# Patient Record
Sex: Female | Born: 1975 | Race: White | Hispanic: No | Marital: Married | Smoking: Never smoker
Health system: Southern US, Community
[De-identification: ages and names within clinical notes are randomized; demographics above are authoritative.]

## PROBLEM LIST (undated history)

## (undated) DIAGNOSIS — F329 Major depressive disorder, single episode, unspecified: Secondary | ICD-10-CM

## (undated) DIAGNOSIS — M5126 Other intervertebral disc displacement, lumbar region: Secondary | ICD-10-CM

## (undated) DIAGNOSIS — K589 Irritable bowel syndrome without diarrhea: Secondary | ICD-10-CM

## (undated) DIAGNOSIS — G4730 Sleep apnea, unspecified: Secondary | ICD-10-CM

## (undated) DIAGNOSIS — F32A Depression, unspecified: Secondary | ICD-10-CM

## (undated) DIAGNOSIS — F419 Anxiety disorder, unspecified: Secondary | ICD-10-CM

## (undated) DIAGNOSIS — M51369 Other intervertebral disc degeneration, lumbar region without mention of lumbar back pain or lower extremity pain: Secondary | ICD-10-CM

## (undated) DIAGNOSIS — M5136 Other intervertebral disc degeneration, lumbar region: Secondary | ICD-10-CM

## (undated) DIAGNOSIS — G709 Myoneural disorder, unspecified: Secondary | ICD-10-CM

## (undated) DIAGNOSIS — C801 Malignant (primary) neoplasm, unspecified: Secondary | ICD-10-CM

## (undated) DIAGNOSIS — G43909 Migraine, unspecified, not intractable, without status migrainosus: Secondary | ICD-10-CM

## (undated) HISTORY — PX: THYROID LOBECTOMY: SHX420

## (undated) HISTORY — PX: NASAL SINUS SURGERY: SHX719

---

## 1999-12-14 ENCOUNTER — Other Ambulatory Visit: Admission: RE | Admit: 1999-12-14 | Discharge: 1999-12-14 | Payer: Self-pay | Admitting: Family Medicine

## 2000-11-24 ENCOUNTER — Other Ambulatory Visit: Admission: RE | Admit: 2000-11-24 | Discharge: 2000-11-24 | Payer: Self-pay

## 2006-11-22 ENCOUNTER — Emergency Department: Payer: Self-pay | Admitting: Emergency Medicine

## 2006-11-22 ENCOUNTER — Other Ambulatory Visit: Payer: Self-pay

## 2006-11-26 ENCOUNTER — Ambulatory Visit: Payer: Self-pay | Admitting: Emergency Medicine

## 2007-12-20 ENCOUNTER — Ambulatory Visit: Payer: Self-pay | Admitting: Family Medicine

## 2008-08-03 ENCOUNTER — Encounter: Payer: Self-pay | Admitting: Physician Assistant

## 2008-08-11 ENCOUNTER — Encounter: Payer: Self-pay | Admitting: Physician Assistant

## 2009-02-11 HISTORY — PX: WISDOM TOOTH EXTRACTION: SHX21

## 2009-10-23 ENCOUNTER — Emergency Department (HOSPITAL_COMMUNITY): Admission: EM | Admit: 2009-10-23 | Discharge: 2009-10-23 | Payer: Self-pay | Admitting: Family Medicine

## 2010-09-10 ENCOUNTER — Inpatient Hospital Stay (INDEPENDENT_AMBULATORY_CARE_PROVIDER_SITE_OTHER)
Admission: RE | Admit: 2010-09-10 | Discharge: 2010-09-10 | Disposition: A | Discharge status: Home / Self Care | Payer: 59 | Source: Ambulatory Visit | Attending: Emergency Medicine | Admitting: Emergency Medicine

## 2010-09-10 DIAGNOSIS — J019 Acute sinusitis, unspecified: Secondary | ICD-10-CM

## 2010-11-29 DIAGNOSIS — K589 Irritable bowel syndrome without diarrhea: Secondary | ICD-10-CM | POA: Insufficient documentation

## 2011-04-09 DIAGNOSIS — G43909 Migraine, unspecified, not intractable, without status migrainosus: Secondary | ICD-10-CM | POA: Insufficient documentation

## 2015-01-08 ENCOUNTER — Encounter: Payer: Self-pay | Admitting: Gynecology

## 2015-01-08 ENCOUNTER — Ambulatory Visit
Admission: EM | Admit: 2015-01-08 | Discharge: 2015-01-08 | Disposition: A | Discharge status: Home / Self Care | Payer: Managed Care, Other (non HMO) | Attending: Family Medicine | Admitting: Family Medicine

## 2015-01-08 DIAGNOSIS — S338XXA Sprain of other parts of lumbar spine and pelvis, initial encounter: Secondary | ICD-10-CM

## 2015-01-08 DIAGNOSIS — S39012A Strain of muscle, fascia and tendon of lower back, initial encounter: Secondary | ICD-10-CM

## 2015-01-08 DIAGNOSIS — M5431 Sciatica, right side: Secondary | ICD-10-CM | POA: Diagnosis not present

## 2015-01-08 HISTORY — DX: Other intervertebral disc degeneration, lumbar region: M51.36

## 2015-01-08 HISTORY — DX: Major depressive disorder, single episode, unspecified: F32.9

## 2015-01-08 HISTORY — DX: Anxiety disorder, unspecified: F41.9

## 2015-01-08 HISTORY — DX: Irritable bowel syndrome, unspecified: K58.9

## 2015-01-08 HISTORY — DX: Depression, unspecified: F32.A

## 2015-01-08 HISTORY — DX: Sleep apnea, unspecified: G47.30

## 2015-01-08 HISTORY — DX: Migraine, unspecified, not intractable, without status migrainosus: G43.909

## 2015-01-08 HISTORY — DX: Other intervertebral disc displacement, lumbar region: M51.26

## 2015-01-08 HISTORY — DX: Other intervertebral disc degeneration, lumbar region without mention of lumbar back pain or lower extremity pain: M51.369

## 2015-01-08 LAB — URINALYSIS COMPLETE WITH MICROSCOPIC (ARMC ONLY)
BILIRUBIN URINE: NEGATIVE
Bacteria, UA: NONE SEEN
Glucose, UA: NEGATIVE mg/dL
Ketones, ur: NEGATIVE mg/dL
Leukocytes, UA: NEGATIVE
NITRITE: NEGATIVE
PROTEIN: NEGATIVE mg/dL
Specific Gravity, Urine: 1.015 (ref 1.005–1.030)
WBC, UA: NONE SEEN WBC/hpf (ref 0–5)
pH: 7 (ref 5.0–8.0)

## 2015-01-08 LAB — PREGNANCY, URINE: Preg Test, Ur: NEGATIVE

## 2015-01-08 MED ORDER — KETOROLAC TROMETHAMINE 60 MG/2ML IM SOLN
60.0000 mg | Freq: Once | INTRAMUSCULAR | Status: AC
  Administered 2015-01-08: 60 mg via INTRAMUSCULAR

## 2015-01-08 MED ORDER — OXYCODONE-ACETAMINOPHEN 5-325 MG PO TABS: 1.0000 | ORAL_TABLET | Freq: Three times a day (TID) | ORAL | Status: DC | PRN

## 2015-01-08 MED ORDER — PREDNISONE 10 MG PO TABS: ORAL_TABLET | ORAL | Status: DC

## 2015-01-08 NOTE — Discharge Instructions (Signed)
Take medication as prescribed. Rest. Apply ice. Avoid strenuous activity. Stretch well multiple times per day as discussed.  Follow-up with her primary care physician this week as discussed. Return to urgent care or proceed to ER for increased pain, urinary or bowel changes, difficulty walking,  for new or worsening concerns.   Sciatica Sciatica is pain, weakness, numbness, or tingling along the path of the sciatic nerve. The nerve starts in the lower back and runs down the back of each leg. The nerve controls the muscles in the lower leg and in the back of the knee, while also providing sensation to the back of the thigh, lower leg, and the sole of your foot. Sciatica is a symptom of another medical condition. For instance, nerve damage or certain conditions, such as a herniated disk or bone spur on the spine, pinch or put pressure on the sciatic nerve. This causes the pain, weakness, or other sensations normally associated with sciatica. Generally, sciatica only affects one side of the body. CAUSES  1. Herniated or slipped disc. 2. Degenerative disk disease. 3. A pain disorder involving the narrow muscle in the buttocks (piriformis syndrome). 4. Pelvic injury or fracture. 5. Pregnancy. 6. Tumor (rare). SYMPTOMS  Symptoms can vary from mild to very severe. The symptoms usually travel from the low back to the buttocks and down the back of the leg. Symptoms can include: 1. Mild tingling or dull aches in the lower back, leg, or hip. 2. Numbness in the back of the calf or sole of the foot. 3. Burning sensations in the lower back, leg, or hip. 4. Sharp pains in the lower back, leg, or hip. 5. Leg weakness. 6. Severe back pain inhibiting movement. These symptoms may get worse with coughing, sneezing, laughing, or prolonged sitting or standing. Also, being overweight may worsen symptoms. DIAGNOSIS  Your caregiver will perform a physical exam to look for common symptoms of sciatica. He or she may ask  you to do certain movements or activities that would trigger sciatic nerve pain. Other tests may be performed to find the cause of the sciatica. These may include: 1. Blood tests. 2. X-rays. 3. Imaging tests, such as an MRI or CT scan. TREATMENT  Treatment is directed at the cause of the sciatic pain. Sometimes, treatment is not necessary and the pain and discomfort goes away on its own. If treatment is needed, your caregiver may suggest: 1. Over-the-counter medicines to relieve pain. 2. Prescription medicines, such as anti-inflammatory medicine, muscle relaxants, or narcotics. 3. Applying heat or ice to the painful area. 4. Steroid injections to lessen pain, irritation, and inflammation around the nerve. 5. Reducing activity during periods of pain. 6. Exercising and stretching to strengthen your abdomen and improve flexibility of your spine. Your caregiver may suggest losing weight if the extra weight makes the back pain worse. 7. Physical therapy. 8. Surgery to eliminate what is pressing or pinching the nerve, such as a bone spur or part of a herniated disk. HOME CARE INSTRUCTIONS  1. Only take over-the-counter or prescription medicines for pain or discomfort as directed by your caregiver. 2. Apply ice to the affected area for 20 minutes, 3-4 times a day for the first 48-72 hours. Then try heat in the same way. 3. Exercise, stretch, or perform your usual activities if these do not aggravate your pain. 4. Attend physical therapy sessions as directed by your caregiver. 5. Keep all follow-up appointments as directed by your caregiver. 6. Do not wear high heels or  shoes that do not provide proper support. 7. Check your mattress to see if it is too soft. A firm mattress may lessen your pain and discomfort. SEEK IMMEDIATE MEDICAL CARE IF:  1. You lose control of your bowel or bladder (incontinence). 2. You have increasing weakness in the lower back, pelvis, buttocks, or legs. 3. You have redness  or swelling of your back. 4. You have a burning sensation when you urinate. 5. You have pain that gets worse when you lie down or awakens you at night. 6. Your pain is worse than you have experienced in the past. 7. Your pain is lasting longer than 4 weeks. 8. You are suddenly losing weight without reason. MAKE SURE YOU: 1. Understand these instructions. 2. Will watch your condition. 3. Will get help right away if you are not doing well or get worse.   This information is not intended to replace advice given to you by your health care provider. Make sure you discuss any questions you have with your health care provider.   Document Released: 01/22/2001 Document Revised: 10/19/2014 Document Reviewed: 06/09/2011 Elsevier Interactive Patient Education 2016 [employer].  Back Exercises If you have pain in your back, do these exercises 2-3 times each day or as told by your doctor. When the pain goes away, do the exercises once each day, but repeat the steps more times for each exercise (do more repetitions). If you do not have pain in your back, do these exercises once each day or as told by your doctor. EXERCISES Single Knee to Chest Do these steps 3-5 times in a row for each leg: 7. Lie on your back on a firm bed or the floor with your legs stretched out. 8. Bring one knee to your chest. 9. Hold your knee to your chest by grabbing your knee or thigh. 10. Pull on your knee until you feel a gentle stretch in your lower back. 11. Keep doing the stretch for 10-30 seconds. 12. Slowly let go of your leg and straighten it. Pelvic Tilt Do these steps 5-10 times in a row: 7. Lie on your back on a firm bed or the floor with your legs stretched out. 8. Bend your knees so they point up to the ceiling. Your feet should be flat on the floor. 9. Tighten your lower belly (abdomen) muscles to press your lower back against the floor. This will make your tailbone point up to the ceiling instead of pointing  down to your feet or the floor. 10. Stay in this position for 5-10 seconds while you gently tighten your muscles and breathe evenly. Cat-Cow Do these steps until your lower back bends more easily: 4. Get on your hands and knees on a firm surface. Keep your hands under your shoulders, and keep your knees under your hips. You may put padding under your knees. 5. Let your head hang down, and make your tailbone point down to the floor so your lower back is round like the back of a cat. 6. Stay in this position for 5 seconds. 7. Slowly lift your head and make your tailbone point up to the ceiling so your back hangs low (sags) like the back of a cow. 8. Stay in this position for 5 seconds. Press-Ups Do these steps 5-10 times in a row: 9. Lie on your belly (face-down) on the floor. 10. Place your hands near your head, about shoulder-width apart. 11. While you keep your back relaxed and keep your hips on the  floor, slowly straighten your arms to raise the top half of your body and lift your shoulders. Do not use your back muscles. To make yourself more comfortable, you may change where you place your hands. 12. Stay in this position for 5 seconds. 13. Slowly return to lying flat on the floor. Bridges Do these steps 10 times in a row: 8. Lie on your back on a firm surface. 9. Bend your knees so they point up to the ceiling. Your feet should be flat on the floor. 10. Tighten your butt muscles and lift your butt off of the floor until your waist is almost as high as your knees. If you do not feel the muscles working in your butt and the back of your thighs, slide your feet 1-2 inches farther away from your butt. 11. Stay in this position for 3-5 seconds. 12. Slowly lower your butt to the floor, and let your butt muscles relax. If this exercise is too easy, try doing it with your arms crossed over your chest. Belly Crunches Do these steps 5-10 times in a row: 9. Lie on your back on a firm bed or the  floor with your legs stretched out. 10. Bend your knees so they point up to the ceiling. Your feet should be flat on the floor. 61. Cross your arms over your chest. 12. Tip your chin a little bit toward your chest but do not bend your neck. 63. Tighten your belly muscles and slowly raise your chest just enough to lift your shoulder blades a tiny bit off of the floor. 14. Slowly lower your chest and your head to the floor. Back Lifts Do these steps 5-10 times in a row: 4. Lie on your belly (face-down) with your arms at your sides, and rest your forehead on the floor. 5. Tighten the muscles in your legs and your butt. 6. Slowly lift your chest off of the floor while you keep your hips on the floor. Keep the back of your head in line with the curve in your back. Look at the floor while you do this. 7. Stay in this position for 3-5 seconds. 8. Slowly lower your chest and your face to the floor. GET HELP IF:  Your back pain gets a lot worse when you do an exercise.  Your back pain does not lessen 2 hours after you exercise. If you have any of these problems, stop doing the exercises. Do not do them again unless your doctor says it is okay. GET HELP RIGHT AWAY IF:  You have sudden, very bad back pain. If this happens, stop doing the exercises. Do not do them again unless your doctor says it is okay.   This information is not intended to replace advice given to you by your health care provider. Make sure you discuss any questions you have with your health care provider.   Document Released: 03/02/2010 Document Revised: 10/19/2014 Document Reviewed: 03/24/2014 Elsevier Interactive Patient Education [employer].

## 2015-01-08 NOTE — ED Notes (Signed)
Patient c/o muscle spasm in lower back radiates to stomach. Patient question the bulging disc she had 8 yrs ago.

## 2015-01-08 NOTE — ED Provider Notes (Signed)
[facility]  ____________________________________________  Time seen: Approximately 10:39 AM  I have reviewed the triage vital signs and the nursing notes.   HISTORY  Chief Complaint Spasms   HPI Vickie Reed is a 39 y.o. female presents for the complaints of right lower back pain. Patient reports that pain onset was 4 days ago. States that she will woke up with the pain in right lower back. States that she initially thought that she had pulled a muscle. Does admit that the day before she had lifted a heavy bag of dog food and twisted. Denies fall or trauma.  Patient reports that she had mild pain initially, but reports that she has had gradual increase in pain in same area. States that if she sits still or lies still she only has minimal pain. however reports that with movement, especially twisting,  her pain increases. States that she feels intermittent spasms in the right lower back triggered by movements. States that pain does intermittently radiates down right lower leg. States right lower leg radiation pain is only with movement and described as a sharp pain down the back of her leg. States no pain at rest. Denies urinary or bowel retention or incontinence. Denies difficulty moving or ambulating.  Denies fall or trauma. Does report history of similar with cervical bulging disc that radiated down left arm, denies current cervical pain. However denies known lumbar problems. States current pain is 7/10 aching and catching with movement. States unrelieved with home flexeril.    Past Medical History  Diagnosis Date  . Depression   . Anxiety   . IBS (irritable bowel syndrome)   . Sleep apnea   . Bulging lumbar disc     cervica spine  . Migraine     There are no active problems to display for this patient.   Past Surgical History  Procedure Laterality Date  . Nasal sinus surgery      Current Outpatient Rx  Name  Route  Sig  Dispense  Refill  . buPROPion  (WELLBUTRIN XL) 300 MG 24 hr tablet   Oral   Take 300 mg by mouth daily.         . clindamycin (CLINDAMAX) 1 % gel   Topical   Apply topically 2 (two) times daily.         Marland Kitchen escitalopram (LEXAPRO) 20 MG tablet   Oral   Take 20 mg by mouth daily.         Marland Kitchen levonorgestrel-ethinyl estradiol (AVIANE,ALESSE,LESSINA) 0.1-20 MG-MCG tablet   Oral   Take 1 tablet by mouth daily.          PCP: Harvel Ricks at [facility]  Allergies Sulfa antibiotics  No family history on file.  Social History Social History  Substance Use Topics  . Smoking status: Never Smoker   . Smokeless tobacco: None  . Alcohol Use: No    Review of Systems Constitutional: No fever/chills Eyes: No visual changes. ENT: No sore throat. Cardiovascular: Denies chest pain. Respiratory: Denies shortness of breath. Gastrointestinal: No abdominal pain.  No nausea, no vomiting.  No diarrhea.  No constipation. Genitourinary: Negative for dysuria. Musculoskeletal: positive for back pain. Skin: Negative for rash. Neurological: Negative for headaches, focal weakness or numbness.  10-point ROS otherwise negative.  ____________________________________________   PHYSICAL EXAM:  VITAL SIGNS: ED Triage Vitals  Enc Vitals Group     BP 01/08/15 1007 117/53 mmHg     Pulse Rate 01/08/15 1007 60     Resp 01/08/15  1007 18     Temp 01/08/15 1007 98.4 F (36.9 C)     Temp Source 01/08/15 1007 Oral     SpO2 01/08/15 1007 100 %     Weight 01/08/15 1007 265 lb (120.203 kg)     Height 01/08/15 1007 5\' 6"  (1.676 m)     Head Cir --      Peak Flow --      Pain Score 01/08/15 1012 4     Pain Loc --      Pain Edu? --      Excl. in [place]? --     Constitutional: Alert and oriented. Well appearing and in no acute distress. Eyes: Conjunctivae are normal. PERRL. EOMI. Head: Atraumatic.  Nose: No congestion/rhinnorhea.  Mouth/Throat: Mucous membranes are moist.  Oropharynx non-erythematous. Neck: No stridor.  No cervical  spine tenderness to palpation. Hematological/Lymphatic/Immunilogical: No cervical lymphadenopathy. Cardiovascular: Normal rate, regular rhythm. Grossly normal heart sounds.  Good peripheral circulation. Respiratory: Normal respiratory effort.  No retractions. Lungs CTAB. Gastrointestinal: Soft and nontender. No distention. Normal Bowel sounds.  No CVA tenderness. Musculoskeletal: No lower or upper extremity tenderness nor edema.  No joint effusions. Bilateral pedal pulses equal and easily palpated.  No midline cervical, thoracic or lumbar TTP. Right lower lumbar at sciatic notch mild to mod TTP, no swelling, no ecchymosis. No pain with left straight leg raise, mild pain with right straight leg raise. No saddle anesthesia. Bilateral plantar and dorsiflexion strong and equal. Steady gait. Changes positions quickly in room without distress.  Neurologic:  Normal speech and language. No gross focal neurologic deficits are appreciated. No gait instability. Skin:  Skin is warm, dry and intact. No rash noted. Psychiatric: Mood and affect are normal. Speech and behavior are normal.  ____________________________________________   LABS (all labs ordered are listed, but only abnormal results are displayed)  Labs Reviewed  URINALYSIS COMPLETEWITH MICROSCOPIC ([place]) - Abnormal; Notable for the following:    Color, Urine STRAW (*)    Hgb urine dipstick TRACE (*)    Squamous Epithelial / LPF 0-5 (*)    All other components within normal limits  PREGNANCY, URINE     INITIAL IMPRESSION / ASSESSMENT AND PLAN / ED COURSE  Pertinent labs & imaging results that were available during my care of the patient were reviewed by me and considered in my medical decision making (see chart for details).  Very well appearing. No acute distress. Presents for right lower back pain. Pain primarily with movement. Denies fall or trauma. Patient states that pain initially onset consistent with pulled muscle. Denies  urinary or bowel changes. Ambulatory in room a steady gait. No focal neurological deficits. Point tender over right sciatic notch, pain reproducible on palpation and with movement. Suspect right sciatica and muscular strain. No midline tenderness. Discussed patient has no trauma no midline tenderness do not recommend imaging at this time. Recommend is pain continues then to have imaging. Patient verbalized understanding and states that she does not want x-ray at this time. Will also evaluate urinalysis.  Will treat patient with oral prednisone taper as well as when necessary Percocet. No recent narcotics according to [place] controlled substance database. Discussed follow-up with primary care physician this week. Discussed follow up with Primary care physician this week. Discussed follow up and return parameters including no resolution or any worsening concerns. Patient verbalized understanding and agreed to plan.   ____________________________________________   FINAL CLINICAL IMPRESSION(S) / ED DIAGNOSES  Final diagnoses:  Right sided sciatica  Lumbosacral strain, initial encounter       Marylene Land, NP 01/08/15 1117

## 2015-03-07 ENCOUNTER — Other Ambulatory Visit: Payer: Self-pay | Admitting: Obstetrics and Gynecology

## 2015-03-07 DIAGNOSIS — Z1231 Encounter for screening mammogram for malignant neoplasm of breast: Secondary | ICD-10-CM

## 2015-03-15 ENCOUNTER — Ambulatory Visit
Admission: RE | Admit: 2015-03-15 | Discharge: 2015-03-15 | Disposition: A | Discharge status: Home / Self Care | Payer: Managed Care, Other (non HMO) | Source: Ambulatory Visit | Attending: Obstetrics and Gynecology | Admitting: Obstetrics and Gynecology

## 2015-03-15 DIAGNOSIS — Z1231 Encounter for screening mammogram for malignant neoplasm of breast: Secondary | ICD-10-CM | POA: Diagnosis not present

## 2015-09-30 ENCOUNTER — Encounter: Payer: Self-pay | Admitting: Gynecology

## 2015-09-30 ENCOUNTER — Ambulatory Visit
Admission: EM | Admit: 2015-09-30 | Discharge: 2015-09-30 | Disposition: A | Discharge status: Home / Self Care | Payer: Managed Care, Other (non HMO) | Attending: Family Medicine | Admitting: Family Medicine

## 2015-09-30 DIAGNOSIS — L0291 Cutaneous abscess, unspecified: Secondary | ICD-10-CM | POA: Diagnosis not present

## 2015-09-30 DIAGNOSIS — L0390 Cellulitis, unspecified: Secondary | ICD-10-CM | POA: Diagnosis not present

## 2015-09-30 MED ORDER — CLINDAMYCIN HCL 300 MG PO CAPS
300.0000 mg | ORAL_CAPSULE | Freq: Three times a day (TID) | ORAL | 0 refills | Status: AC
Start: 2015-09-30 — End: 2015-10-07

## 2015-09-30 NOTE — ED Triage Notes (Signed)
Patient c/o abscess on Left buttock. Per patient prone to having recurrent abscess.

## 2015-09-30 NOTE — ED Provider Notes (Signed)
CSN: [id number]     Arrival date & time 09/30/15  1514 History   None    Chief Complaint  Patient presents with  . Cyst   (Consider location/radiation/quality/duration/timing/severity/associated sxs/prior Treatment) Vickie Reed is a 40 y.o female with history of recurrent abscess, presents today for abscess to her mild/right buttocks. Abscess have been present for 6 days. Home therapy includes running hot water over the area during shower. Have not tried warm compress.       Past Medical History:  Diagnosis Date  . Anxiety   . Bulging lumbar disc    cervica spine  . Depression   . IBS (irritable bowel syndrome)   . Migraine   . Sleep apnea    Past Surgical History:  Procedure Laterality Date  . NASAL SINUS SURGERY     Family History  Problem Relation Age of Onset  . Breast cancer Neg Hx    Social History  Substance Use Topics  . Smoking status: Never Smoker  . Smokeless tobacco: Never Used  . Alcohol use No   OB History    No data available     Review of Systems  Skin:       Positive for abscess to right/mid abscess  All other systems reviewed and are negative.   Allergies  Sulfa antibiotics  Home Medications   Prior to Admission medications   Medication Sig Start Date End Date Taking? Authorizing Provider  buPROPion (WELLBUTRIN XL) 300 MG 24 hr tablet Take 300 mg by mouth daily.   Yes Historical Provider, MD  clindamycin (CLINDAMAX) 1 % gel Apply topically 2 (two) times daily.   Yes Historical Provider, MD  escitalopram (LEXAPRO) 20 MG tablet Take 20 mg by mouth daily.   Yes Historical Provider, MD  levonorgestrel-ethinyl estradiol (AVIANE,ALESSE,LESSINA) 0.1-20 MG-MCG tablet Take 1 tablet by mouth daily.   Yes Historical Provider, MD  clindamycin (CLEOCIN) 300 MG capsule Take 1 capsule (300 mg total) by mouth 3 (three) times daily. 09/30/15 10/07/15  Barry Dienes, NP  oxyCODONE-acetaminophen (ROXICET) 5-325 MG tablet Take 1 tablet by mouth every 8  (eight) hours as needed for moderate pain or severe pain (Do not drive or operate heavy machinery while taking as can cause drowsiness.). 01/08/15   Marylene Land, NP  predniSONE (DELTASONE) 10 MG tablet Start 60 mg po day one, then 50 mg po day two, taper by 10 mg daily until complete. 01/08/15   Marylene Land, NP   Meds Ordered and Administered this Visit  Medications - No data to display  BP 118/60 (BP Location: Right Arm)   Pulse 84   Temp 99.9 F (37.7 C) (Tympanic)   Ht 5\' 6"  (1.676 m)   Wt 260 lb (117.9 kg)   LMP 09/24/2015   SpO2 97%   BMI 41.97 kg/m  No data found.   Physical Exam  Constitutional: She appears well-developed and well-nourished.  Cardiovascular: Normal rate, regular rhythm and normal heart sounds.   No murmur heard. Pulmonary/Chest: Effort normal and breath sounds normal.  Skin:     2cm red and indurated area at the right inner buttock, with additional 4cm erythema. Tender on touch. Hard and indurated, has not come to a head.     Urgent Care Course   Clinical Course    Procedures (including critical care time)  Labs Review Labs Reviewed - No data to display  Imaging Review No results found.    MDM   1. Abscess and cellulitis    Abscess  is hard and indurated. Will treat with oral antibiotic and warm compress four times a time 15 minutes each time, hoping the abscess will resolve. Informed to return for I&D if abscess comes to a head or becomes fluctuance. All questions were answered. Patient discharged home in good condition.     Barry Dienes, NP 09/30/15 [phone]

## 2016-03-20 ENCOUNTER — Other Ambulatory Visit: Payer: Self-pay | Admitting: Obstetrics and Gynecology

## 2016-03-20 DIAGNOSIS — Z1231 Encounter for screening mammogram for malignant neoplasm of breast: Secondary | ICD-10-CM

## 2016-04-16 ENCOUNTER — Ambulatory Visit: Payer: Managed Care, Other (non HMO)

## 2016-05-09 ENCOUNTER — Ambulatory Visit: Payer: Managed Care, Other (non HMO)

## 2016-06-04 ENCOUNTER — Ambulatory Visit
Admission: RE | Admit: 2016-06-04 | Discharge: 2016-06-04 | Disposition: A | Discharge status: Home / Self Care | Payer: Managed Care, Other (non HMO) | Source: Ambulatory Visit | Attending: Obstetrics and Gynecology | Admitting: Obstetrics and Gynecology

## 2016-06-04 DIAGNOSIS — Z1231 Encounter for screening mammogram for malignant neoplasm of breast: Secondary | ICD-10-CM | POA: Diagnosis not present

## 2016-10-18 ENCOUNTER — Other Ambulatory Visit: Payer: Self-pay | Admitting: Obstetrics and Gynecology

## 2016-12-12 DIAGNOSIS — C801 Malignant (primary) neoplasm, unspecified: Secondary | ICD-10-CM

## 2016-12-12 HISTORY — DX: Malignant (primary) neoplasm, unspecified: C80.1

## 2017-05-23 ENCOUNTER — Other Ambulatory Visit: Payer: Self-pay | Admitting: Obstetrics and Gynecology

## 2017-05-27 NOTE — Patient Instructions (Addendum)
Your procedure is scheduled on: Monday June 09, 2017 at 9:15 am  Enter through the Main Entrance of [facility] at: 7:45 am  Pick up the phone at the desk and dial [phone].  Call this number if you have problems the morning of surgery: [phone].  Remember: Do NOT eat food or drink any liquids after: Midnight on Sunday April 28  Take these medicines the morning of surgery with a SIP OF WATER: BALCOLTRA, WELLBUTRIN XL, CLARITIN  STOP ALL VITAMINS AND SUPPLEMENTS 1 WEEK PRIOR TO SURGERY  DO NOT SMOKE DAY OF SURGERY  Do NOT wear jewelry (body piercing), metal hair clips/bobby pins, make-up, or nail polish. Do NOT wear lotions, powders, or perfumes.  You may wear deoderant. Do NOT shave for 48 hours prior to surgery. Do NOT bring valuables to the hospital. Contacts, dentures, or bridgework may not be worn into surgery.  Have a responsible adult drive you home and stay with you for 24 hours after your procedure

## 2017-05-28 ENCOUNTER — Encounter (HOSPITAL_COMMUNITY): Payer: Self-pay

## 2017-05-28 ENCOUNTER — Other Ambulatory Visit: Payer: Self-pay

## 2017-05-28 ENCOUNTER — Encounter (HOSPITAL_COMMUNITY)
Admission: RE | Admit: 2017-05-28 | Discharge: 2017-05-28 | Disposition: A | Discharge status: Home / Self Care | Payer: Managed Care, Other (non HMO) | Source: Ambulatory Visit | Attending: Obstetrics and Gynecology | Admitting: Obstetrics and Gynecology

## 2017-05-28 DIAGNOSIS — Z01812 Encounter for preprocedural laboratory examination: Secondary | ICD-10-CM | POA: Insufficient documentation

## 2017-05-28 HISTORY — DX: Malignant (primary) neoplasm, unspecified: C80.1

## 2017-05-28 HISTORY — DX: Myoneural disorder, unspecified: G70.9

## 2017-05-28 LAB — CBC
HEMATOCRIT: 37.7 % (ref 36.0–46.0)
HEMOGLOBIN: 12.5 g/dL (ref 12.0–15.0)
MCH: 29.1 pg (ref 26.0–34.0)
MCHC: 33.2 g/dL (ref 30.0–36.0)
MCV: 87.7 fL (ref 78.0–100.0)
Platelets: 335 10*3/uL (ref 150–400)
RBC: 4.3 MIL/uL (ref 3.87–5.11)
RDW: 13.7 % (ref 11.5–15.5)
WBC: 10 10*3/uL (ref 4.0–10.5)

## 2017-06-08 NOTE — Anesthesia Preprocedure Evaluation (Addendum)
Anesthesia Evaluation  Patient identified by MRN, date of birth, ID band Patient awake    Reviewed: Allergy & Precautions, NPO status , Patient's Chart, lab work & pertinent test results  Airway Mallampati: III  TM Distance: >3 FB Neck ROM: Full    Dental no notable dental hx.    Pulmonary sleep apnea and Continuous Positive Airway Pressure Ventilation ,    Pulmonary exam normal breath sounds clear to auscultation       Cardiovascular Exercise Tolerance: Good negative cardio ROS Normal cardiovascular exam Rhythm:Regular Rate:Normal     Neuro/Psych  Headaches, Anxiety    GI/Hepatic negative GI ROS, Neg liver ROS,   Endo/Other  negative endocrine ROSMorbid obesity  Renal/GU negative Renal ROS     Musculoskeletal   Abdominal (+) + obese,   Peds  Hematology   Anesthesia Other Findings   Reproductive/Obstetrics negative OB ROS                           Lab Results  Component Value Date   WBC 10.0 05/28/2017   HGB 12.5 05/28/2017   HCT 37.7 05/28/2017   MCV 87.7 05/28/2017   PLT 335 05/28/2017    Anesthesia Physical Anesthesia Plan  ASA: III  Anesthesia Plan: General   Post-op Pain Management:    Induction: Intravenous  PONV Risk Score and Plan: 4 or greater and Treatment may vary due to age or medical condition, Dexamethasone, Ondansetron and Scopolamine patch - Pre-op  Airway Management Planned: Oral ETT  Additional Equipment:   Intra-op Plan:   Post-operative Plan: Extubation in OR  Informed Consent: I have reviewed the patients History and Physical, chart, labs and discussed the procedure including the risks, benefits and alternatives for the proposed anesthesia with the patient or authorized representative who has indicated his/her understanding and acceptance.   Dental advisory given  Plan Discussed with: CRNA  Anesthesia Plan Comments:        Anesthesia Quick  Evaluation

## 2017-06-09 ENCOUNTER — Encounter (HOSPITAL_COMMUNITY)
Admission: RE | Disposition: A | Discharge status: Home / Self Care | Payer: Self-pay | Source: Ambulatory Visit | Attending: Obstetrics and Gynecology

## 2017-06-09 ENCOUNTER — Encounter (HOSPITAL_COMMUNITY): Payer: Self-pay

## 2017-06-09 ENCOUNTER — Ambulatory Visit (HOSPITAL_COMMUNITY)
Admission: RE | Admit: 2017-06-09 | Discharge: 2017-06-09 | Disposition: A | Discharge status: Home / Self Care | Payer: Managed Care, Other (non HMO) | Source: Ambulatory Visit | Attending: Obstetrics and Gynecology | Admitting: Obstetrics and Gynecology

## 2017-06-09 ENCOUNTER — Other Ambulatory Visit: Payer: Self-pay

## 2017-06-09 ENCOUNTER — Ambulatory Visit (HOSPITAL_COMMUNITY): Payer: Managed Care, Other (non HMO) | Admitting: Anesthesiology

## 2017-06-09 DIAGNOSIS — N858 Other specified noninflammatory disorders of uterus: Secondary | ICD-10-CM | POA: Insufficient documentation

## 2017-06-09 DIAGNOSIS — F329 Major depressive disorder, single episode, unspecified: Secondary | ICD-10-CM | POA: Diagnosis not present

## 2017-06-09 DIAGNOSIS — G4730 Sleep apnea, unspecified: Secondary | ICD-10-CM | POA: Diagnosis not present

## 2017-06-09 DIAGNOSIS — Z79899 Other long term (current) drug therapy: Secondary | ICD-10-CM | POA: Insufficient documentation

## 2017-06-09 DIAGNOSIS — Z6841 Body Mass Index (BMI) 40.0 and over, adult: Secondary | ICD-10-CM | POA: Diagnosis not present

## 2017-06-09 DIAGNOSIS — Z302 Encounter for sterilization: Secondary | ICD-10-CM | POA: Insufficient documentation

## 2017-06-09 DIAGNOSIS — N920 Excessive and frequent menstruation with regular cycle: Secondary | ICD-10-CM | POA: Diagnosis not present

## 2017-06-09 HISTORY — PX: LAPAROSCOPIC TUBAL LIGATION: SHX1937

## 2017-06-09 HISTORY — PX: DILITATION & CURRETTAGE/HYSTROSCOPY WITH NOVASURE ABLATION: SHX5568

## 2017-06-09 LAB — PREGNANCY, URINE: Preg Test, Ur: NEGATIVE

## 2017-06-09 SURGERY — LIGATION, FALLOPIAN TUBE, LAPAROSCOPIC
Anesthesia: General

## 2017-06-09 MED ORDER — KETOROLAC TROMETHAMINE 30 MG/ML IJ SOLN
INTRAMUSCULAR | Status: AC
  Filled 2017-06-09: qty 1

## 2017-06-09 MED ORDER — SODIUM CHLORIDE 0.9 % IJ SOLN
INTRAMUSCULAR | Status: DC | PRN
  Administered 2017-06-09: 10 mL

## 2017-06-09 MED ORDER — SUGAMMADEX SODIUM 200 MG/2ML IV SOLN
INTRAVENOUS | Status: DC | PRN
  Administered 2017-06-09: 200 mg via INTRAVENOUS

## 2017-06-09 MED ORDER — GABAPENTIN 300 MG PO CAPS
ORAL_CAPSULE | ORAL | Status: AC
  Administered 2017-06-09: 300 mg via ORAL
  Filled 2017-06-09: qty 1

## 2017-06-09 MED ORDER — BUPIVACAINE HCL (PF) 0.25 % IJ SOLN
INTRAMUSCULAR | Status: AC
  Filled 2017-06-09: qty 10

## 2017-06-09 MED ORDER — LIDOCAINE HCL (CARDIAC) PF 100 MG/5ML IV SOSY
PREFILLED_SYRINGE | INTRAVENOUS | Status: AC
  Filled 2017-06-09: qty 5

## 2017-06-09 MED ORDER — KETOROLAC TROMETHAMINE 30 MG/ML IJ SOLN
INTRAMUSCULAR | Status: DC | PRN
  Administered 2017-06-09: 30 mg via INTRAVENOUS
  Administered 2017-06-09: 30 mg via INTRAMUSCULAR

## 2017-06-09 MED ORDER — FENTANYL CITRATE (PF) 100 MCG/2ML IJ SOLN
INTRAMUSCULAR | Status: DC | PRN
  Administered 2017-06-09 (×2): 100 ug via INTRAVENOUS
  Administered 2017-06-09: 50 ug via INTRAVENOUS

## 2017-06-09 MED ORDER — PROMETHAZINE HCL 25 MG/ML IJ SOLN: 6.2500 mg | INTRAMUSCULAR | Status: DC | PRN

## 2017-06-09 MED ORDER — HYDROMORPHONE HCL 1 MG/ML IJ SOLN
INTRAMUSCULAR | Status: AC
  Filled 2017-06-09: qty 0.5

## 2017-06-09 MED ORDER — SODIUM CHLORIDE 0.9 % IR SOLN
Status: DC | PRN
  Administered 2017-06-09: 3000 mL

## 2017-06-09 MED ORDER — ROCURONIUM BROMIDE 100 MG/10ML IV SOLN
INTRAVENOUS | Status: DC | PRN
  Administered 2017-06-09: 60 mg via INTRAVENOUS

## 2017-06-09 MED ORDER — BUPIVACAINE HCL (PF) 0.25 % IJ SOLN
INTRAMUSCULAR | Status: DC | PRN
  Administered 2017-06-09: 10 mL

## 2017-06-09 MED ORDER — DEXAMETHASONE SODIUM PHOSPHATE 10 MG/ML IJ SOLN
INTRAMUSCULAR | Status: AC
  Filled 2017-06-09: qty 1

## 2017-06-09 MED ORDER — ROCURONIUM BROMIDE 100 MG/10ML IV SOLN
INTRAVENOUS | Status: AC
  Filled 2017-06-09: qty 1

## 2017-06-09 MED ORDER — LIDOCAINE HCL (CARDIAC) PF 100 MG/5ML IV SOSY
PREFILLED_SYRINGE | INTRAVENOUS | Status: DC | PRN
  Administered 2017-06-09: 100 mg via INTRAVENOUS

## 2017-06-09 MED ORDER — MIDAZOLAM HCL 2 MG/2ML IJ SOLN
INTRAMUSCULAR | Status: AC
  Filled 2017-06-09: qty 2

## 2017-06-09 MED ORDER — GLYCOPYRROLATE 0.2 MG/ML IJ SOLN
INTRAMUSCULAR | Status: AC
  Filled 2017-06-09: qty 1

## 2017-06-09 MED ORDER — ACETAMINOPHEN 500 MG PO TABS
ORAL_TABLET | ORAL | Status: AC
  Administered 2017-06-09: 1000 mg via ORAL
  Filled 2017-06-09: qty 2

## 2017-06-09 MED ORDER — SCOPOLAMINE 1 MG/3DAYS TD PT72
1.0000 | MEDICATED_PATCH | Freq: Once | TRANSDERMAL | Status: DC
  Administered 2017-06-09: 1.5 mg via TRANSDERMAL

## 2017-06-09 MED ORDER — ACETAMINOPHEN 10 MG/ML IV SOLN: 1000.0000 mg | Freq: Once | INTRAVENOUS | Status: DC | PRN

## 2017-06-09 MED ORDER — CHLOROPROCAINE HCL 1 % IJ SOLN
INTRAMUSCULAR | Status: DC | PRN
  Administered 2017-06-09: 20 mL

## 2017-06-09 MED ORDER — HYDROCODONE-ACETAMINOPHEN 5-325 MG PO TABS: 1.0000 | ORAL_TABLET | Freq: Four times a day (QID) | ORAL | 0 refills | Status: AC | PRN

## 2017-06-09 MED ORDER — SCOPOLAMINE 1 MG/3DAYS TD PT72
MEDICATED_PATCH | TRANSDERMAL | Status: AC
  Administered 2017-06-09: 1.5 mg via TRANSDERMAL
  Filled 2017-06-09: qty 1

## 2017-06-09 MED ORDER — MIDAZOLAM HCL 2 MG/2ML IJ SOLN
INTRAMUSCULAR | Status: DC | PRN
  Administered 2017-06-09: 2 mg via INTRAVENOUS

## 2017-06-09 MED ORDER — GLYCOPYRROLATE 0.2 MG/ML IJ SOLN
INTRAMUSCULAR | Status: DC | PRN
  Administered 2017-06-09: .2 mg via INTRAVENOUS

## 2017-06-09 MED ORDER — ACETAMINOPHEN 500 MG PO TABS
1000.0000 mg | ORAL_TABLET | Freq: Once | ORAL | Status: AC
  Administered 2017-06-09: 1000 mg via ORAL

## 2017-06-09 MED ORDER — LACTATED RINGERS IV SOLN
INTRAVENOUS | Status: DC
  Administered 2017-06-09: 11:00:00 via INTRAVENOUS
  Administered 2017-06-09: 125 mL/h via INTRAVENOUS

## 2017-06-09 MED ORDER — MEPERIDINE HCL 25 MG/ML IJ SOLN: 6.2500 mg | INTRAMUSCULAR | Status: DC | PRN

## 2017-06-09 MED ORDER — DEXAMETHASONE SODIUM PHOSPHATE 10 MG/ML IJ SOLN
INTRAMUSCULAR | Status: DC | PRN
  Administered 2017-06-09: 10 mg via INTRAVENOUS

## 2017-06-09 MED ORDER — HYDROCODONE-ACETAMINOPHEN 7.5-325 MG PO TABS: 1.0000 | ORAL_TABLET | Freq: Once | ORAL | Status: DC | PRN

## 2017-06-09 MED ORDER — GABAPENTIN 300 MG PO CAPS
300.0000 mg | ORAL_CAPSULE | Freq: Once | ORAL | Status: AC
  Administered 2017-06-09: 300 mg via ORAL

## 2017-06-09 MED ORDER — CHLOROPROCAINE HCL 1 % IJ SOLN
INTRAMUSCULAR | Status: AC
  Filled 2017-06-09: qty 30

## 2017-06-09 MED ORDER — HYDROMORPHONE HCL 1 MG/ML IJ SOLN
0.2500 mg | INTRAMUSCULAR | Status: DC | PRN
  Administered 2017-06-09: 0.5 mg via INTRAVENOUS

## 2017-06-09 MED ORDER — PROPOFOL 10 MG/ML IV BOLUS
INTRAVENOUS | Status: DC | PRN
  Administered 2017-06-09: 200 mg via INTRAVENOUS

## 2017-06-09 MED ORDER — ONDANSETRON HCL 4 MG/2ML IJ SOLN
INTRAMUSCULAR | Status: AC
  Filled 2017-06-09: qty 2

## 2017-06-09 MED ORDER — ONDANSETRON HCL 4 MG/2ML IJ SOLN
INTRAMUSCULAR | Status: DC | PRN
  Administered 2017-06-09: 4 mg via INTRAVENOUS

## 2017-06-09 MED ORDER — SUGAMMADEX SODIUM 200 MG/2ML IV SOLN
INTRAVENOUS | Status: AC
  Filled 2017-06-09: qty 2

## 2017-06-09 MED ORDER — FENTANYL CITRATE (PF) 250 MCG/5ML IJ SOLN
INTRAMUSCULAR | Status: AC
  Filled 2017-06-09: qty 5

## 2017-06-09 MED ORDER — PROPOFOL 10 MG/ML IV BOLUS
INTRAVENOUS | Status: AC
  Filled 2017-06-09: qty 20

## 2017-06-09 SURGICAL SUPPLY — 29 items
ABLATOR ENDOMETRIAL BIPOLAR (ABLATOR) ×3 IMPLANT
CANISTER SUCT 3000ML PPV (MISCELLANEOUS) ×3 IMPLANT
CATH ROBINSON RED A/P 16FR (CATHETERS) ×3 IMPLANT
DRSG OPSITE POSTOP 3X4 (GAUZE/BANDAGES/DRESSINGS) IMPLANT
DURAPREP 26ML APPLICATOR (WOUND CARE) ×3 IMPLANT
ELECT REM PT RETURN 9FT ADLT (ELECTROSURGICAL) ×3
ELECTRODE REM PT RTRN 9FT ADLT (ELECTROSURGICAL) ×2 IMPLANT
GLOVE BIOGEL PI IND STRL 7.0 (GLOVE) ×4 IMPLANT
GLOVE BIOGEL PI INDICATOR 7.0 (GLOVE) ×2
GLOVE ECLIPSE 6.5 STRL STRAW (GLOVE) ×3 IMPLANT
GOWN STRL REUS W/TWL LRG LVL3 (GOWN DISPOSABLE) ×6 IMPLANT
NEEDLE INSUFFLATION 120MM (ENDOMECHANICALS) ×3 IMPLANT
PACK LAPAROSCOPY BASIN (CUSTOM PROCEDURE TRAY) ×3 IMPLANT
PACK TRENDGUARD 450 HYBRID PRO (MISCELLANEOUS) IMPLANT
PACK TRENDGUARD 600 HYBRD PROC (MISCELLANEOUS) ×2 IMPLANT
PACK VAGINAL MINOR WOMEN LF (CUSTOM PROCEDURE TRAY) ×3 IMPLANT
PAD OB MATERNITY 4.3X12.25 (PERSONAL CARE ITEMS) ×3 IMPLANT
PENCIL BUTTON HOLSTER BLD 10FT (ELECTRODE) ×3 IMPLANT
PROTECTOR NERVE ULNAR (MISCELLANEOUS) ×6 IMPLANT
SLEEVE XCEL OPT CAN 5 100 (ENDOMECHANICALS) ×3 IMPLANT
SUT VICRYL 0 UR6 27IN ABS (SUTURE) ×3 IMPLANT
SUT VICRYL 4-0 PS2 18IN ABS (SUTURE) ×3 IMPLANT
TOWEL OR 17X24 6PK STRL BLUE (TOWEL DISPOSABLE) ×6 IMPLANT
TRENDGUARD 450 HYBRID PRO PACK (MISCELLANEOUS)
TRENDGUARD 600 HYBRID PROC PK (MISCELLANEOUS) ×3
TROCAR OPTI TIP 5M 100M (ENDOMECHANICALS) ×3 IMPLANT
TROCAR XCEL DIL TIP R 11M (ENDOMECHANICALS) ×3 IMPLANT
TUBING AQUILEX INFLOW (TUBING) ×3 IMPLANT
TUBING AQUILEX OUTFLOW (TUBING) ×3 IMPLANT

## 2017-06-09 NOTE — Transfer of Care (Signed)
Immediate Anesthesia Transfer of Care Note  Patient: Vickie Reed  Procedure(s) Performed: LAPAROSCOPIC TUBAL LIGATION (Bilateral ) DILATATION & CURETTAGE/HYSTEROSCOPY WITH NOVASURE ABLATION (N/A )  Patient Location: PACU  Anesthesia Type:General  Level of Consciousness: awake, alert  and oriented  Airway & Oxygen Therapy: Patient Spontanous Breathing and Patient connected to face mask oxygen  Post-op Assessment: Report given to RN, Post -op Vital signs reviewed and stable and Patient moving all extremities X 4  Post vital signs: Reviewed and stable  Last Vitals:  Vitals Value Taken Time  BP 112/55 06/09/2017 11:15 AM  Temp    Pulse 86 06/09/2017 11:16 AM  Resp 18 06/09/2017 11:16 AM  SpO2 100 % 06/09/2017 11:16 AM  Vitals shown include unvalidated device data.  Last Pain:  Vitals:   06/09/17 0751  TempSrc: Oral      Patients Stated Pain Goal: 1 (30/94/07 6808)  Complications: No apparent anesthesia complications

## 2017-06-09 NOTE — Anesthesia Postprocedure Evaluation (Signed)
Anesthesia Post Note  Patient: Faigy Stretch Lux-Sullivan  Procedure(s) Performed: LAPAROSCOPIC TUBAL LIGATION (Bilateral ) DILATATION & CURETTAGE/HYSTEROSCOPY WITH NOVASURE ABLATION (N/A )     Patient location during evaluation: PACU Anesthesia Type: General Level of consciousness: awake and alert Pain management: pain level controlled Vital Signs Assessment: post-procedure vital signs reviewed and stable Respiratory status: spontaneous breathing, nonlabored ventilation, respiratory function stable and patient connected to nasal cannula oxygen Cardiovascular status: blood pressure returned to baseline and stable Postop Assessment: no apparent nausea or vomiting Anesthetic complications: no    Last Vitals:  Vitals:   06/09/17 1152 06/09/17 1208  BP:    Pulse: 77 76  Resp: 18 18  Temp:    SpO2: 100% 100%    Last Pain:  Vitals:   06/09/17 1145  TempSrc:   PainSc: 2    Pain Goal: Patients Stated Pain Goal: 1 (06/09/17 0751)               Barnet Glasgow

## 2017-06-09 NOTE — Anesthesia Procedure Notes (Signed)
Procedure Name: Intubation Date/Time: 06/09/2017 9:54 AM Performed by: Barnet Glasgow, MD Pre-anesthesia Checklist: Patient identified, Patient being monitored, Timeout performed, Emergency Drugs available and Suction available Patient Re-evaluated:Patient Re-evaluated prior to induction Oxygen Delivery Method: Circle System Utilized Preoxygenation: Pre-oxygenation with 100% oxygen Induction Type: IV induction Ventilation: Mask ventilation without difficulty Laryngoscope Size: Miller and 2 Grade View: Grade II Tube type: Oral Tube size: 7.0 mm Number of attempts: 1 Placement Confirmation: ETT inserted through vocal cords under direct vision,  positive ETCO2 and breath sounds checked- equal and bilateral Secured at: 21 cm Tube secured with: Tape Dental Injury: Teeth and Oropharynx as per pre-operative assessment

## 2017-06-09 NOTE — Brief Op Note (Signed)
06/09/2017  11:29 AM  PATIENT:  Vickie Reed  42 y.o. female  PRE-OPERATIVE DIAGNOSIS:  Desires Sterilization, Menorrhagia  POST-OPERATIVE DIAGNOSIS:  Desires Sterilization, Menorrhagia  PROCEDURE:  Laparoscopic tubal ligation with bipolar cautery, dx hysteroscopy, D&C, novasure endometrial ablation  SURGEON:  Surgeon(s) and Role:    * Honor Frison, Alanda Slim, MD - Primary  PHYSICIAN ASSISTANT:   ASSISTANTS: none   ANESTHESIA:   general and paracervical block FINDINGS: nl tubes and ovaries, nl uterus, nl liver edge, tubal ostia seen. No endometrial lesion noted EBL:  5 mL   BLOOD ADMINISTERED:none  DRAINS: none   LOCAL MEDICATIONS USED:  MARCAINE    and OTHER nesicaine  SPECIMEN:  Source of Specimen:  emc  DISPOSITION OF SPECIMEN:  PATHOLOGY  COUNTS:  YES  TOURNIQUET:  * No tourniquets in log *  DICTATION: .Other Dictation: Dictation Number [id number] PLAN OF CARE: Discharge to home after PACU  PATIENT DISPOSITION:  PACU - hemodynamically stable.   Delay start of Pharmacological VTE agent (>24hrs) due to surgical blood loss or risk of bleeding: no

## 2017-06-09 NOTE — Discharge Instructions (Signed)
CALL  IF TEMP>100.4, NOTHING PER VAGINA X 2 WK, CALL IF SOAKING A MAXI  PAD EVERY HOUR OR MORE FREQUENTLY   DISCHARGE INSTRUCTIONS: Laparoscopy  The following instructions have been prepared to help you care for yourself upon your return home today.  Wound care:  Do not get the incision wet for the first 24 hours. The incision should be kept clean and dry.  The Band-Aids or dressings may be removed the day after surgery.  Should the incision become sore, red, and swollen after the first week, check with your doctor.  Personal hygiene:  Shower the day after your procedure.  Activity and limitations:  Do NOT drive or operate any equipment today.  Do NOT lift anything more than 15 pounds for 2-3 weeks after surgery.  Do NOT rest in bed all day.  Walking is encouraged. Walk each day, starting slowly with 5-minute walks 3 or 4 times a day. Slowly increase the length of your walks.  Walk up and down stairs slowly.  Do NOT do strenuous activities, such as golfing, playing tennis, bowling, running, biking, weight lifting, gardening, mowing, or vacuuming for 2-4 weeks. Ask your doctor when it is okay to start.  Diet: Eat a light meal as desired this evening. You may resume your usual diet tomorrow.  Return to work: This is dependent on the type of work you do. For the most part you can return to a desk job within a week of surgery. If you are more active at work, please discuss this with your doctor.  What to expect after your surgery: You may have a slight burning sensation when you urinate on the first day. You may have a very small amount of blood in the urine. Expect to have a small amount of vaginal discharge/light bleeding for 1-2 weeks. It is not unusual to have abdominal soreness and bruising for up to 2 weeks. You may be tired and need more rest for about 1 week. You may experience shoulder pain for 24-72 hours. Lying flat in bed may relieve it.  Call your doctor for any of the  following:  Develop a fever of 100.4 or greater  Inability to urinate 6 hours after discharge from hospital  Severe pain not relieved by pain medications  Persistent of heavy bleeding at incision site  Redness or swelling around incision site after a week  Increasing nausea or vomiting  Patient Signature________________________________________ Nurse Signature_________________________________________   DISCHARGE INSTRUCTIONS: HYSTEROSCOPY / ENDOMETRIAL ABLATION The following instructions have been prepared to help you care for yourself upon your return home.  May Remove Scop patch on or before 06/12/17  May take Ibuprofen after 4:50pm today    Personal hygiene:  Use sanitary pads for vaginal drainage, not tampons.  Shower the day after your procedure.  NO tub baths, pools or Jacuzzis for 2-3 weeks.  Wipe front to back after using the bathroom.  Activity and limitations:  Do NOT drive or operate any equipment for 24 hours. The effects of anesthesia are still present and drowsiness may result.  Do NOT rest in bed all day.  Walking is encouraged.  Walk up and down stairs slowly.  You may resume your normal activity in one to two days or as indicated by your physician. Sexual activity: NO intercourse for at least 2 weeks after the procedure, or as indicated by your Doctor.  Diet: Eat a light meal as desired this evening. You may resume your usual diet tomorrow.  Return to Work:  You may resume your work activities in one to two days or as indicated by Marine scientist.  What to expect after your surgery: Expect to have vaginal bleeding/discharge for 2-3 days and spotting for up to 10 days. It is not unusual to have soreness for up to 1-2 weeks. You may have a slight burning sensation when you urinate for the first day. Mild cramps may continue for a couple of days. You may have a regular period in 2-6 weeks.  Call your doctor for any of the following:  Excessive  vaginal bleeding or clotting, saturating and changing one pad every hour.  Inability to urinate 6 hours after discharge from hospital.  Pain not relieved by pain medication.  Fever of 100.4 F or greater.  Unusual vaginal discharge or odor.  Return to office _________________Call for an appointment ___________________ Patients signature: ______________________ Nurses signature ________________________  Post Anesthesia Care Unit [phone]   Post Anesthesia Home Care Instructions  Activity: Get plenty of rest for the remainder of the day. A responsible individual must stay with you for 24 hours following the procedure.  For the next 24 hours, DO NOT: -Drive a car -Paediatric nurse -Drink alcoholic beverages -Take any medication unless instructed by your physician -Make any legal decisions or sign important papers.  Meals: Start with liquid foods such as gelatin or soup. Progress to regular foods as tolerated. Avoid greasy, spicy, heavy foods. If nausea and/or vomiting occur, drink only clear liquids until the nausea and/or vomiting subsides. Call your physician if vomiting continues.  Special Instructions/Symptoms: Your throat may feel dry or sore from the anesthesia or the breathing tube placed in your throat during surgery. If this causes discomfort, gargle with warm salt water. The discomfort should disappear within 24 hours.  If you had a scopolamine patch placed behind your ear for the management of post- operative nausea and/or vomiting:  1. The medication in the patch is effective for 72 hours, after which it should be removed.  Wrap patch in a tissue and discard in the trash. Wash hands thoroughly with soap and water. 2. You may remove the patch earlier than 72 hours if you experience unpleasant side effects which may include dry mouth, dizziness or visual disturbances. 3. Avoid touching the patch. Wash your hands with soap and water after contact with the patch.

## 2017-06-10 ENCOUNTER — Encounter (HOSPITAL_COMMUNITY): Payer: Self-pay | Admitting: Obstetrics and Gynecology

## 2017-06-10 NOTE — Op Note (Signed)
NAME:  Vickie Reed, Vickie Reed               ACCOUNT NO.:  MEDICAL RECORD NO.:  [id number]  LOCATION:                                 FACILITY:  PHYSICIAN:  Servando Salina, M.D.    DATE OF BIRTH:  DATE OF PROCEDURE:  06/09/2017 DATE OF DISCHARGE:                              OPERATIVE REPORT   PREOPERATIVE DIAGNOSES:  Desires sterilization, menorrhagia.  PROCEDURES:  Laparoscopic tubal ligation with bipolar cautery, diagnostic hysteroscopy, dilation and curettage, NovaSure endometrial ablation.  POSTOPERATIVE DIAGNOSES:  Desires sterilization, menorrhagia.  ANESTHESIA:  General, paracervical block.  SURGEON:  Servando Salina, M.D.  ASSISTANT:  None.  DESCRIPTION OF PROCEDURE:  Under adequate general anesthesia, the patient was placed in the dorsal lithotomy position.  She was sterilely prepped and draped in the usual fashion.  An indwelling Foley catheter was sterilely placed.  A bivalve speculum was placed in the vagina. Single-tooth tenaculum was placed on the anterior lip of the cervix.  20 mL of 1% Nesacaine was injected paracervically at the 3 and 9 o'clock positions.  The cervix was then serially dilated up to #25 [facility] dilator.  The uterus sounded to 9 cm.  The endocervical canal sounded to 3.5 cm.  The endometrium was inspected with the hysteroscope.  No discrete mass was noted and in the endometrial wall, some thickness was noted.  The cavity was curetted.  The NovaSure endometrial ablation apparatus was inserted and ultimately a cavity width of 2.5, power of 62 watts, and 1 minute and 56 seconds' worth of ablation occurred.  The cervical length was about 4.5.  Once that was done, the hysteroscope was removed.  An Acorn cannula was introduced into the cervical os and attached to tenaculum for manipulation of the uterus and the bivalve speculum was removed.  Attention was then turned to the abdomen.  A 0.25% Marcaine was injected.  Infraumbilical incision was  made in a vertical fashion.  Veress needle was introduced.  Opening pressure of 10 was noted.  3.1 L of CO2 was insufflated.  Veress needle was then removed.  A 10 mm disposable trocar with sleeve was introduced into the abdomen.  A lighted videolaparoscope was then inserted.  Panoramic inspection was done.  Normal liver edge and gallbladder were noted.  The patient was placed in Trendelenburg position.  Suprapubic incision was then made after 0.25% Marcaine was injected.  A 5 mm port was introduced under direct visualization.  A probe was then used to inspect the pelvis again.  The uterus was noted. Both tubes and ovaries were noted to be normal.  No evidence of endometrial implants on the anterior or posterior cul-de-sac. Using the bipolar cautery, the midportion of both fallopian tubes was cauterized.  Once that was felt to be adequate, the suprapubic port was removed under direct visualization.  The abdomen was deflated and the infraumbilical site was then removed taking care not to bring up any underlying structures. Incisions were closed with 4-0 Vicryl subcuticular closure.  SPECIMEN:  Endometrial curetting sent to Pathology.  ESTIMATED BLOOD LOSS:  5 mL.  COMPLICATIONS:  None.  The patient tolerated the procedure well, was transferred to recovery room in stable condition.  Servando Salina, M.D.     [place]/MEDQ  D:  06/09/2017  T:  06/10/2017  Job:  [id number]

## 2018-05-20 DIAGNOSIS — R69 Illness, unspecified: Secondary | ICD-10-CM | POA: Diagnosis not present

## 2018-06-24 DIAGNOSIS — R69 Illness, unspecified: Secondary | ICD-10-CM | POA: Diagnosis not present

## 2018-08-13 DIAGNOSIS — R3129 Other microscopic hematuria: Secondary | ICD-10-CM | POA: Diagnosis not present

## 2018-08-13 DIAGNOSIS — R8290 Unspecified abnormal findings in urine: Secondary | ICD-10-CM | POA: Diagnosis not present

## 2018-08-13 DIAGNOSIS — M546 Pain in thoracic spine: Secondary | ICD-10-CM | POA: Diagnosis not present

## 2018-08-13 DIAGNOSIS — R1010 Upper abdominal pain, unspecified: Secondary | ICD-10-CM | POA: Diagnosis not present

## 2018-08-17 DIAGNOSIS — R69 Illness, unspecified: Secondary | ICD-10-CM | POA: Diagnosis not present

## 2018-09-18 DIAGNOSIS — R69 Illness, unspecified: Secondary | ICD-10-CM | POA: Diagnosis not present

## 2018-09-19 DIAGNOSIS — R69 Illness, unspecified: Secondary | ICD-10-CM | POA: Diagnosis not present

## 2018-09-28 DIAGNOSIS — R0789 Other chest pain: Secondary | ICD-10-CM | POA: Diagnosis not present

## 2018-09-28 DIAGNOSIS — M940 Chondrocostal junction syndrome [Tietze]: Secondary | ICD-10-CM | POA: Diagnosis not present

## 2018-10-07 DIAGNOSIS — R69 Illness, unspecified: Secondary | ICD-10-CM | POA: Diagnosis not present

## 2018-10-10 DIAGNOSIS — R69 Illness, unspecified: Secondary | ICD-10-CM | POA: Diagnosis not present

## 2018-10-17 DIAGNOSIS — R69 Illness, unspecified: Secondary | ICD-10-CM | POA: Diagnosis not present

## 2018-10-21 DIAGNOSIS — R69 Illness, unspecified: Secondary | ICD-10-CM | POA: Diagnosis not present

## 2018-10-24 DIAGNOSIS — R69 Illness, unspecified: Secondary | ICD-10-CM | POA: Diagnosis not present

## 2018-11-07 DIAGNOSIS — R69 Illness, unspecified: Secondary | ICD-10-CM | POA: Diagnosis not present

## 2019-04-18 ENCOUNTER — Ambulatory Visit: Payer: 59 | Attending: Internal Medicine

## 2019-04-18 DIAGNOSIS — Z23 Encounter for immunization: Secondary | ICD-10-CM

## 2019-04-18 NOTE — Progress Notes (Signed)
   Covid-19 Vaccination Clinic  Name:  Vickie Reed    MRN: [id number] DOB: Oct 21, 1975  04/18/2019  [name] was observed post Covid-19 immunization for 15 minutes without incident. She was provided with Vaccine Information Sheet and instruction to access the V-Safe system.   [name] was instructed to call 911 with any severe reactions post vaccine: Marland Kitchen Difficulty breathing  . Swelling of face and throat  . A fast heartbeat  . A bad rash all over body  . Dizziness and weakness   Immunizations Administered    Name Date Dose VIS Date Route   Pfizer COVID-19 Vaccine 04/18/2019  2:45 PM 0.3 mL 01/22/2019 Intramuscular   Manufacturer: [place]   Lot: [id number]   [place]: [id number]

## 2019-05-12 ENCOUNTER — Ambulatory Visit: Payer: 59 | Attending: Internal Medicine

## 2019-05-12 DIAGNOSIS — Z23 Encounter for immunization: Secondary | ICD-10-CM

## 2019-05-12 NOTE — Progress Notes (Signed)
   Covid-19 Vaccination Clinic  Name:  Vickie Reed    MRN: [id number] DOB: 1975/12/02  05/12/2019  [name] was observed post Covid-19 immunization for 15 minutes without incident. She was provided with Vaccine Information Sheet and instruction to access the V-Safe system.   Ms. Yong was instructed to call 911 with any severe reactions post vaccine: Marland Kitchen Difficulty breathing  . Swelling of face and throat  . A fast heartbeat  . A bad rash all over body  . Dizziness and weakness   Immunizations Administered    Name Date Dose VIS Date Route   Pfizer COVID-19 Vaccine 05/12/2019  4:02 PM 0.3 mL 01/22/2019 Intramuscular   Manufacturer: [place]   Lot: [id number]   [place]: [id number]

## 2020-03-27 ENCOUNTER — Ambulatory Visit (INDEPENDENT_AMBULATORY_CARE_PROVIDER_SITE_OTHER): Payer: 59 | Admitting: Internal Medicine

## 2020-03-27 VITALS — BP 173/83 | HR 88 | Resp 18 | Ht 66.0 in | Wt 274.0 lb

## 2020-03-27 DIAGNOSIS — G4733 Obstructive sleep apnea (adult) (pediatric): Secondary | ICD-10-CM

## 2020-03-27 DIAGNOSIS — R030 Elevated blood-pressure reading, without diagnosis of hypertension: Secondary | ICD-10-CM | POA: Insufficient documentation

## 2020-03-27 DIAGNOSIS — Z9989 Dependence on other enabling machines and devices: Secondary | ICD-10-CM

## 2020-03-27 DIAGNOSIS — Z7189 Other specified counseling: Secondary | ICD-10-CM | POA: Diagnosis not present

## 2020-03-27 DIAGNOSIS — G4730 Sleep apnea, unspecified: Secondary | ICD-10-CM | POA: Insufficient documentation

## 2020-03-27 DIAGNOSIS — J309 Allergic rhinitis, unspecified: Secondary | ICD-10-CM | POA: Insufficient documentation

## 2020-03-27 DIAGNOSIS — F419 Anxiety disorder, unspecified: Secondary | ICD-10-CM | POA: Insufficient documentation

## 2020-03-27 NOTE — Progress Notes (Unsigned)
Sleep Medicine   Office Visit  Patient Name: Vickie Reed DOB: April 28, 1975 MRN [id number]    Chief Complaint: sleep apnea   HISTORY OF PRESENT ILLNESS: Vickie Reed is seen today for initial consultation.She has a 12 year history of sleep apnea. She reports doing well on her CPAP until recently. She started feeling as if she could not breathe. This happens when she is lying in bed. This happens intermittently. She also reports getting condensation in the mask. Her husband has told her that she feels as if she is breathing harder at night.  She denies feeling anxious. She denies RLS symptoms. She goes to bed around 10 p.m. She wakes once to urinate and sometimes she has difficulty reinitiating sleep.She gets up at 7 a.m.She feels rested when she wakes. She gets drowsy after lunch. The Epworth Sleepiness Score is 11.   ROS  General: (-) fever, (-) chills, (-) night sweat Nose and Sinuses: (-) nasal stuffiness or itchiness, (-) postnasal drip, (-) nosebleeds, (-) sinus trouble. Mouth and Throat: (-) sore throat, (-) hoarseness. Neck: (-) swollen glands, (-) enlarged thyroid, (-) neck pain. Respiratory: - cough, - shortness of breath, - wheezing. Neurologic: - numbness, - tingling. Psychiatric: - anxiety, - depression Sleep behavior: -sleep paralysis -hypnogogic hallucinations -dream enactment      -vivid dreams -cataplexy -night terrors -sleep walking   Current Medication: Outpatient Encounter Medications as of 03/27/2020  Medication Sig  . buPROPion (WELLBUTRIN XL) 300 MG 24 hr tablet Take 300 mg by mouth daily.  Marland Kitchen CRANBERRY PO Take 2 tablets by mouth daily.  . DULoxetine (CYMBALTA) 20 MG capsule Take 40 mg by mouth daily.  . Lactobacillus Rhamnosus, GG, (CULTURELLE IMMUNITY SUPPORT PO) Take 1 capsule by mouth every evening.  . loratadine (CLARITIN) 10 MG tablet Take 10 mg by mouth daily.  . Loratadine 10 MG CAPS Take by mouth.  . Multiple Vitamin (MULTIVITAMIN WITH MINERALS) TABS  tablet Take 1 tablet by mouth every evening. Women's One-A-Day  . Soft Lens Products (REWETTING DROPS) SOLN Place 1 drop into both eyes daily as needed (for irritation/dry eyes.).  . [DISCONTINUED] BALCOLTRA 0.1-20 MG-MCG(21) TABS Take 1 tablet by mouth daily.  . [DISCONTINUED] escitalopram (LEXAPRO) 20 MG tablet Take 20 mg by mouth every evening.    No facility-administered encounter medications on file as of 03/27/2020.    Surgical History: Past Surgical History:  Procedure Laterality Date  . DILITATION & CURRETTAGE/HYSTROSCOPY WITH NOVASURE ABLATION N/A 06/09/2017   Procedure: DILATATION & CURETTAGE/HYSTEROSCOPY WITH NOVASURE ABLATION;  Surgeon: Servando Salina, MD;  Location: [facility];  Service: Gynecology;  Laterality: N/A;  . LAPAROSCOPIC TUBAL LIGATION Bilateral 06/09/2017   Procedure: LAPAROSCOPIC TUBAL LIGATION;  Surgeon: Servando Salina, MD;  Location: [place] [facility];  Service: Gynecology;  Laterality: Bilateral;  . NASAL SINUS SURGERY    . WISDOM TOOTH EXTRACTION  2011    Medical History: Past Medical History:  Diagnosis Date  . Anxiety   . Bulging lumbar disc    cervica spine  . Cancer ([place]) 12/2016   SKIN UPPER CHEST NON SMALL CELL SQUAMOUS   . Depression   . IBS (irritable bowel syndrome)   . Migraine   . 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 Sleep apnea     Family History: Non contributory to the present illness  Social History: Social History   Socioeconomic History  . Marital status: Married    Spouse name: Not on file  . Number of children: Not  on file  . Years of education: Not on file  . Highest education level: Not on file  Occupational History  . Not on file  Tobacco Use  . Smoking status: Never Smoker  . Smokeless tobacco: Never Used  Vaping Use  . Vaping Use: Never used  Substance and Sexual Activity  . Alcohol use: No  . Drug use: No  . Sexual activity: Not on file  Other Topics Concern  . Not on file  Social  History Narrative  . Not on file   Social Determinants of Health   Financial Resource Strain: Not on file  Food Insecurity: Not on file  Transportation Needs: Not on file  Physical Activity: Not on file  Stress: Not on file  Social Connections: Not on file  Intimate Partner Violence: Not on file    Vital Signs: Blood pressure (!) 173/83, pulse 88, resp. rate 18, height 5\' 6"  (1.676 m), weight 274 lb (124.3 kg), SpO2 98 %.  Examination: General Appearance: The patient is well-developed, well-nourished, and in no distress. Neck Circumference: 45.5 cm Skin: Gross inspection of skin unremarkable. Head: normocephalic, no gross deformities. Eyes: no gross deformities noted. ENT: ears appear grossly normal Neurologic: Alert and oriented. No involuntary movements.    EPWORTH SLEEPINESS SCALE:  Scale:  (0)= no chance of dozing; (1)= slight chance of dozing; (2)= moderate chance of dozing; (3)= high chance of dozing  Chance  Situtation    Sitting and reading: 1    Watching TV: 1    Sitting Inactive in public: 1    As a passenger in car: 3      Lying down to rest: 3    Sitting and talking: 0    Sitting quielty after lunch: 2    In a car, stopped in traffic: 0   TOTAL SCORE:   11 out of 24    SLEEP STUDIES:  1. Split 10/24/2008   LABS: No results found for this or any previous visit (from the past 2160 hour(s)).  Radiology: No results found.  No results found.  No results found.    Assessment and Plan: Patient Active Problem List   Diagnosis Date Noted  . CPAP use counseling 03/27/2020  . Allergic rhinitis 03/27/2020  . Anxiety and depression 03/27/2020  . Sleep apnea 03/27/2020  . Migraine 04/09/2011  . IBS (irritable bowel syndrome) 11/29/2010     PLAN OSA:   Patient has a 10 year history of sleep apnea with reported excellent compliance. Recently she has been feeling as if she cannot breathe on the CPAP when is is relaxing just prior to  sleep. She did not bring in her machine today so it is difficult to determine any cause. She was informed how to adjust the humidifier. She was advised to try turning off the  Ramp to see if this helps and we will get a download once she brings in her machine. If nothing significant is observed, will schedule her for a PSG on her current CPAP setting.   1. OSA-continue nightly use of CPAP. Bring machine in for download. 2. CPAP use counseling-  CPAP Counseling: had a lengthy discussion with the patient regarding the importance of PAP therapy in management of the sleep apnea. Patient appears to understand the risk factor reduction and also understands the risks associated with untreated sleep apnea. Patient will try to make a good faith effort to remain compliant with therapy. Also instructed the patient on proper cleaning of the device including  the water must be changed daily if possible and use of distilled water is preferred. Patient understands that the machine should be regularly cleaned with appropriate recommended cleaning solutions that do not damage the PAP machine for example given white vinegar and water rinses. Other methods such as ozone treatment may not be as good as these simple methods to achieve cleaning. 3. Elevated blood pressure without the diagnosis of hypertension: discussed today's reading. 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  4. Obesity- Obesity Counseling: Had a lengthy discussion regarding patients BMI and weight issues. Patient was instructed on portion control as well as increased activity. Also discussed caloric restrictions with trying to maintain intake less than 2000 Kcal. Discussions were made in accordance with the 5As of weight management. Simple actions such as not eating late and if able to, taking a walk is suggested.   Hypertension Counseling:   The following hypertensive lifestyle modification were recommended and discussed:   1. Limiting alcohol intake to less than 1 oz/day of ethanol:(24 oz of beer or 8 oz of wine or 2 oz of 100-proof whiskey). 2. Take baby ASA 81 mg daily. 3. Importance of regular aerobic exercise and losing weight. 4. Reduce dietary saturated fat and cholesterol intake for overall cardiovascular health. 5. Maintaining adequate dietary potassium, calcium, and magnesium intake. 6. Regular monitoring of the blood pressure. 7. Reduce sodium intake to less than 100 mmol/day (less than 2.3 gm of sodium or less than 6 gm of sodium choride)    General Counseling: I have discussed the findings of the evaluation and examination with [facility].  I have also discussed any further diagnostic evaluation thatmay be needed or ordered today. Vickie Reed verbalizes understanding of the findings of todays visit. We also reviewed her medications today and discussed drug interactions and side effects including but not limited excessive drowsiness and altered mental states. We also discussed that there is always a risk not just to her but also people around her. she has been encouraged to call the office with any questions or concerns that should arise related to todays visit.  No orders of the defined types were placed in this encounter.       I have personally obtained a history, evaluated the patient, evaluated pertinent data, formulated the assessment and plan and placed orders.  This patient was seen today by Vickie Ellis, PA-C in collaboration with Dr. Devona Konig.  Richelle Ito Saunders Glance, PhD, FAASM  Diplomate, American Board of Sleep Medicine    Allyne Gee, MD [facility] Diplomate ABMS Pulmonary and Critical Care Medicine Sleep medicine

## 2020-03-27 NOTE — Progress Notes (Signed)
error 

## 2020-03-27 NOTE — Patient Instructions (Signed)

## 2021-03-26 ENCOUNTER — Ambulatory Visit (INDEPENDENT_AMBULATORY_CARE_PROVIDER_SITE_OTHER): Payer: Managed Care, Other (non HMO) | Admitting: Internal Medicine

## 2021-03-26 VITALS — BP 140/80 | HR 76 | Resp 16 | Ht 66.0 in | Wt 275.0 lb

## 2021-03-26 DIAGNOSIS — Z9989 Dependence on other enabling machines and devices: Secondary | ICD-10-CM | POA: Diagnosis not present

## 2021-03-26 DIAGNOSIS — Z7189 Other specified counseling: Secondary | ICD-10-CM

## 2021-03-26 DIAGNOSIS — G4733 Obstructive sleep apnea (adult) (pediatric): Secondary | ICD-10-CM

## 2021-03-26 NOTE — Progress Notes (Signed)
[facility] [place], [place] [address]  Pulmonary Sleep Medicine   Office Visit Note  Patient Name: Vickie Reed DOB: 07-19-1975 MRN [id number]    Chief Complaint: Obstructive Sleep Apnea visit  Brief History:  Vickie Reed is seen today for follow up appointment. The patient has a 13 year history of sleep apnea. Patient is using PAP nightly.  The patient feels very well after sleeping with PAP.  The patient reports benefiting from PAP use. Reported sleepiness is  resolved and the Epworth Sleepiness Score is 11 out of 24. The patient does take naps not on cpap for approx 2 hours.. The patient complains of the following: no complaints  The compliance download shows  compliance with an average use time of 8:57 hours@ 99%.. The AHI is 3.8  The patient does not complain of limb movements disrupting sleep.  ROS  General: (-) fever, (-) chills, (-) night sweat Nose and Sinuses: (-) nasal stuffiness or itchiness, (-) postnasal drip, (-) nosebleeds, (-) sinus trouble. Mouth and Throat: (-) sore throat, (-) hoarseness. Neck: (-) swollen glands, (-) enlarged thyroid, (-) neck pain. Respiratory: - cough, - shortness of breath, - wheezing. Neurologic: - numbness, - tingling. Psychiatric: - anxiety, - depression   Current Medication: Outpatient Encounter Medications as of 03/26/2021  Medication Sig   buPROPion (WELLBUTRIN XL) 300 MG 24 hr tablet Take 1 tablet by mouth every morning.   buPROPion (WELLBUTRIN XL) 300 MG 24 hr tablet Take 300 mg by mouth daily.   CRANBERRY PO Take 2 tablets by mouth daily.   Cranberry POWD Take 2 tablets by mouth daily.   DULoxetine (CYMBALTA) 20 MG capsule Take 40 mg by mouth daily.   Lactobacillus Rhamnosus, GG, (CULTURELLE IMMUNITY SUPPORT PO) Take 1 capsule by mouth every evening.   loratadine (CLARITIN) 10 MG tablet Take 10 mg by mouth daily.   Loratadine 10 MG CAPS Take by mouth.   Loratadine 10 MG CAPS Take by mouth.    Multiple Vitamin (MULTIVITAMIN WITH MINERALS) TABS tablet Take 1 tablet by mouth every evening. Women's One-A-Day   Soft Lens Products (REWETTING DROPS) SOLN Place 1 drop into both eyes daily as needed (for irritation/dry eyes.).   No facility-administered encounter medications on file as of 03/26/2021.    Surgical History: Past Surgical History:  Procedure Laterality Date   DILITATION & CURRETTAGE/HYSTROSCOPY WITH NOVASURE ABLATION N/A 06/09/2017   Procedure: DILATATION & CURETTAGE/HYSTEROSCOPY WITH NOVASURE ABLATION;  Surgeon: Servando Salina, MD;  Location: [facility];  Service: Gynecology;  Laterality: N/A;   LAPAROSCOPIC TUBAL LIGATION Bilateral 06/09/2017   Procedure: LAPAROSCOPIC TUBAL LIGATION;  Surgeon: Servando Salina, MD;  Location: [facility];  Service: Gynecology;  Laterality: Bilateral;   NASAL SINUS SURGERY     WISDOM TOOTH EXTRACTION  2011    Medical History: Past Medical History:  Diagnosis Date   Anxiety    Bulging lumbar disc    cervica spine   Cancer ([place]) 12/2016   SKIN UPPER CHEST NON SMALL CELL SQUAMOUS    Depression    IBS (irritable bowel syndrome)    Migraine    Neuromuscular disorder (HCC)    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   Sleep apnea     Family History: Non contributory to the present illness  Social History: Social History   Socioeconomic History   Marital status: Married    Spouse name: Not on file   Number of children: Not on file   Years of education: Not on file  Highest education level: Not on file  Occupational History   Not on file  Tobacco Use   Smoking status: Never   Smokeless tobacco: Never  Vaping Use   Vaping Use: Never used  Substance and Sexual Activity   Alcohol use: No   Drug use: No   Sexual activity: Not on file  Other Topics Concern   Not on file  Social History Narrative   Not on file   Social Determinants of Health   Financial Resource Strain: Not on file  Food Insecurity: Not on file  Transportation  Needs: Not on file  Physical Activity: Not on file  Stress: Not on file  Social Connections: Not on file  Intimate Partner Violence: Not on file    Vital Signs: Blood pressure 140/80, pulse 76, resp. rate 16, height 5\' 6"  (1.676 m), weight 275 lb (124.7 kg), SpO2 96 %. Body mass index is 44.39 kg/m.    Examination: General Appearance: The patient is well-developed, well-nourished, and in no distress. Neck Circumference: 45.5 cm Skin: Gross inspection of skin unremarkable. Head: normocephalic, no gross deformities. Eyes: no gross deformities noted. ENT: ears appear grossly normal Neurologic: Alert and oriented. No involuntary movements.    EPWORTH SLEEPINESS SCALE:  Scale:  (0)= no chance of dozing; (1)= slight chance of dozing; (2)= moderate chance of dozing; (3)= high chance of dozing  Chance  Situtation    Sitting and reading: 2    Watching TV: 1    Sitting Inactive in public: 1    As a passenger in car: 3      Lying down to rest: 3    Sitting and talking: 0    Sitting quielty after lunch: 1    In a car, stopped in traffic: 0   TOTAL SCORE:   11 out of 24    SLEEP STUDIES:  Split 10/24/2008 - overall AHI 36,  low SpO2 87%   CPAP COMPLIANCE DATA:  Date Range: 03/26/20 - 03/25/21  Average Daily Use: 8:57 hours  Median Use: 8:57 hours  Compliance for > 4 Hours: 99%  AHI: 3.8 respiratory events per hour  Days Used: 363/365  Mask Leak: 20.7 lpm  95th Percentile Pressure: 7 cmH2O    LABS: No results found for this or any previous visit (from the past 2160 hour(s)).  Radiology: No results found.  No results found.  No results found.    Assessment and Plan: Patient Active Problem List   Diagnosis Date Noted   OSA on CPAP 03/26/2021   Morbid obesity ([place]) 03/26/2021   CPAP use counseling 03/27/2020   Allergic rhinitis 03/27/2020   Anxiety and depression 03/27/2020   Sleep apnea 03/27/2020   Migraine 04/09/2011   IBS (irritable  bowel syndrome) 11/29/2010   1. OSA on CPAP The patient does tolerate PAP and reports  benefit from PAP use. Machine is past end of life and must be replaced.  The patient was reminded how to clean equipment and advised to replace supplies routinely. The patient was also counselled on weight loss. The compliance is excellent. The AHI is 3.8.   OSA- continue with excellent compliance with pap. Replace machine, f/u 30d after set up, or one year if she decides to hold off on replacement.   2. CPAP use counseling CPAP Counseling: had a lengthy discussion with the patient regarding the importance of PAP therapy in management of the sleep apnea. Patient appears to understand the risk factor reduction and also understands the risks associated  with untreated sleep apnea. Patient will try to make a good faith effort to remain compliant with therapy. Also instructed the patient on proper cleaning of the device including the water must be changed daily if possible and use of distilled water is preferred. Patient understands that the machine should be regularly cleaned with appropriate recommended cleaning solutions that do not damage the PAP machine for example given white vinegar and water rinses. Other methods such as ozone treatment may not be as good as these simple methods to achieve cleaning.   3. Morbid obesity ([place]) CPAP Counseling: had a lengthy discussion with the patient regarding the importance of PAP therapy in management of the sleep apnea. Patient appears to understand the risk factor reduction and also understands the risks associated with untreated sleep apnea. Patient will try to make a good faith effort to remain compliant with therapy. Also instructed the patient on proper cleaning of the device including the water must be changed daily if possible and use of distilled water is preferred. Patient understands that the machine should be regularly cleaned with appropriate recommended cleaning  solutions that do not damage the PAP machine for example given white vinegar and water rinses. Other methods such as ozone treatment may not be as good as these simple methods to achieve cleaning.     General Counseling: I have discussed the findings of the evaluation and examination with [facility].  I have also discussed any further diagnostic evaluation thatmay be needed or ordered today. Kenlynn verbalizes understanding of the findings of todays visit. We also reviewed her medications today and discussed drug interactions and side effects including but not limited excessive drowsiness and altered mental states. We also discussed that there is always a risk not just to her but also people around her. she has been encouraged to call the office with any questions or concerns that should arise related to todays visit.  No orders of the defined types were placed in this encounter.       I have personally obtained a history, examined the patient, evaluated laboratory and imaging results, formulated the assessment and plan and placed orders. This patient was seen today by Tressie Ellis, PA-C in collaboration with Dr. Devona Konig.   Allyne Gee, MD [facility] Diplomate ABMS Pulmonary Critical Care Medicine and Sleep Medicine

## 2021-03-26 NOTE — Patient Instructions (Signed)

## 2021-05-29 ENCOUNTER — Other Ambulatory Visit: Payer: Self-pay | Admitting: Neurology

## 2021-05-29 DIAGNOSIS — R200 Anesthesia of skin: Secondary | ICD-10-CM

## 2021-05-29 DIAGNOSIS — M542 Cervicalgia: Secondary | ICD-10-CM

## 2021-06-13 ENCOUNTER — Ambulatory Visit
Admission: RE | Admit: 2021-06-13 | Discharge: 2021-06-13 | Disposition: A | Discharge status: Home / Self Care | Payer: Self-pay | Source: Ambulatory Visit | Attending: Neurology | Admitting: Neurology

## 2021-06-13 DIAGNOSIS — M542 Cervicalgia: Secondary | ICD-10-CM

## 2021-06-13 DIAGNOSIS — R202 Paresthesia of skin: Secondary | ICD-10-CM

## 2021-11-30 DIAGNOSIS — E041 Nontoxic single thyroid nodule: Secondary | ICD-10-CM | POA: Insufficient documentation

## 2021-12-12 DIAGNOSIS — E890 Postprocedural hypothyroidism: Secondary | ICD-10-CM | POA: Insufficient documentation

## 2022-03-25 ENCOUNTER — Ambulatory Visit (INDEPENDENT_AMBULATORY_CARE_PROVIDER_SITE_OTHER): Payer: BC Managed Care – PPO | Admitting: Internal Medicine

## 2022-03-25 VITALS — BP 141/81 | HR 74 | Resp 16 | Ht 66.0 in | Wt 275.0 lb

## 2022-03-25 DIAGNOSIS — Z7189 Other specified counseling: Secondary | ICD-10-CM

## 2022-03-25 DIAGNOSIS — G4733 Obstructive sleep apnea (adult) (pediatric): Secondary | ICD-10-CM | POA: Diagnosis not present

## 2022-03-25 NOTE — Progress Notes (Signed)
[facility] [place], [place] [address]  Pulmonary Sleep Medicine   Office Visit Note  Patient Name: Vickie Reed DOB: [birth year] MRN [id number]    Chief Complaint: Obstructive Sleep Apnea visit  Brief History:  Vickie Reed is seen today for an annual follow up on CPAP at 7 cmh20. The patient has a 14 year history of sleep apnea. Patient is using PAP nightly.  The patient feels rested after sleeping with PAP.  The patient reports benefiting from PAP use. Reported sleepiness is not improved due to thyroid and the Epworth Sleepiness Score is 11 out of 24. The patient does take naps, once on the weekend. The patient complains of the following: No complaints.  The compliance download shows 100% compliance with an average use time of 8 hours 30 minutes. The AHI is 5.8.  The patient does not complain of limb movements disrupting sleep.  ROS  General: (-) fever, (-) chills, (-) night sweat Nose and Sinuses: (-) nasal stuffiness or itchiness, (-) postnasal drip, (-) nosebleeds, (-) sinus trouble. Mouth and Throat: (-) sore throat, (-) hoarseness. Neck: (-) swollen glands, (-) enlarged thyroid, (-) neck pain. Respiratory: - cough, - shortness of breath, - wheezing. Neurologic: - numbness, - tingling. Psychiatric: - anxiety, - depression   Current Medication: Outpatient Encounter Medications as of 03/25/2022  Medication Sig   buPROPion (WELLBUTRIN XL) 300 MG 24 hr tablet Take by mouth.   DULoxetine (CYMBALTA) 20 MG capsule Take 2 capsules by mouth daily.   meloxicam (MOBIC) 15 MG tablet Take by mouth.   Cranberry POWD Take 2 tablets by mouth daily.   Lactobacillus Rhamnosus, GG, (CULTURELLE IMMUNITY SUPPORT PO) Take 1 capsule by mouth every evening.   levothyroxine (SYNTHROID) 50 MCG tablet Take 50 mcg by mouth daily.   loratadine (CLARITIN) 10 MG tablet Take 10 mg by mouth daily.   Multiple Vitamin (MULTIVITAMIN WITH MINERALS) TABS tablet Take 1 tablet by  mouth every evening. Women's One-A-Day   Soft Lens Products (REWETTING DROPS) SOLN Place 1 drop into both eyes daily as needed (for irritation/dry eyes.).   [DISCONTINUED] buPROPion (WELLBUTRIN XL) 300 MG 24 hr tablet Take 300 mg by mouth daily.   [DISCONTINUED] buPROPion (WELLBUTRIN XL) 300 MG 24 hr tablet Take 1 tablet by mouth every morning.   [DISCONTINUED] CRANBERRY PO Take 2 tablets by mouth daily.   [DISCONTINUED] DULoxetine (CYMBALTA) 20 MG capsule Take 40 mg by mouth daily.   [DISCONTINUED] Loratadine 10 MG CAPS Take by mouth.   [DISCONTINUED] Loratadine 10 MG CAPS Take by mouth.   No facility-administered encounter medications on file as of 03/25/2022.    Surgical History: Past Surgical History:  Procedure Laterality Date   DILITATION & CURRETTAGE/HYSTROSCOPY WITH NOVASURE ABLATION N/A 06/09/2017   Procedure: DILATATION & CURETTAGE/HYSTEROSCOPY WITH NOVASURE ABLATION;  Surgeon: Servando Salina, MD;  Location: [facility];  Service: Gynecology;  Laterality: N/A;   LAPAROSCOPIC TUBAL LIGATION Bilateral 06/09/2017   Procedure: LAPAROSCOPIC TUBAL LIGATION;  Surgeon: Servando Salina, MD;  Location: [facility];  Service: Gynecology;  Laterality: Bilateral;   NASAL SINUS SURGERY     THYROID LOBECTOMY     WISDOM TOOTH EXTRACTION  2011    Medical History: Past Medical History:  Diagnosis Date   Anxiety    Bulging lumbar disc    cervica spine   Cancer ([place]) 12/2016   SKIN UPPER CHEST NON SMALL CELL SQUAMOUS    Depression    IBS (irritable bowel syndrome)    Migraine  Neuromuscular disorder (HCC)    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   Sleep apnea     Family History: Non contributory to the present illness  Social History: Social History   Socioeconomic History   Marital status: Married    Spouse name: Not on file   Number of children: Not on file   Years of education: Not on file   Highest education level: Not on file  Occupational History   Not on file  Tobacco Use    Smoking status: Never   Smokeless tobacco: Never  Vaping Use   Vaping Use: Never used  Substance and Sexual Activity   Alcohol use: No   Drug use: No   Sexual activity: Not on file  Other Topics Concern   Not on file  Social History Narrative   Not on file   Social Determinants of Health   Financial Resource Strain: Not on file  Food Insecurity: Not on file  Transportation Needs: Not on file  Physical Activity: Not on file  Stress: Not on file  Social Connections: Not on file  Intimate Partner Violence: Not on file    Vital Signs: Blood pressure (!) 141/81, pulse 74, resp. rate 16, height 5' 6"$  (1.676 m), weight 275 lb (124.7 kg), SpO2 98 %. Body mass index is 44.39 kg/m.    Examination: General Appearance: The patient is well-developed, well-nourished, and in no distress. Neck Circumference: 43 cm Skin: Gross inspection of skin unremarkable. Head: normocephalic, no gross deformities. Eyes: no gross deformities noted. ENT: ears appear grossly normal Neurologic: Alert and oriented. No involuntary movements.  STOP BANG RISK ASSESSMENT S (snore) Have you been told that you snore?     NO   T (tired) Are you often tired, fatigued, or sleepy during the day?   YES  O (obstruction) Do you stop breathing, choke, or gasp during sleep? NO   P (pressure) Do you have or are you being treated for high blood pressure? NO   B (BMI) Is your body index greater than 35 kg/m? YES   A (age) Are you 9 years old or older? NO   N (neck) Do you have a neck circumference greater than 16 inches?   YES   G (gender) Are you a female? NO   TOTAL STOP/BANG "YES" ANSWERS 3       A STOP-Bang score of 2 or less is considered low risk, and a score of 5 or more is high risk for having either moderate or severe OSA. For people who score 3 or 4, doctors may need to perform further assessment to determine how likely they are to have OSA.         EPWORTH SLEEPINESS SCALE:  Scale:  (0)= no  chance of dozing; (1)= slight chance of dozing; (2)= moderate chance of dozing; (3)= high chance of dozing  Chance  Situtation    Sitting and reading: 3    Watching TV: 1    Sitting Inactive in public: 1    As a passenger in car: 2      Lying down to rest: 3    Sitting and talking: 0    Sitting quielty after lunch: 1    In a car, stopped in traffic: 0   TOTAL SCORE:   11 out of 24    SLEEP STUDIES:  SPLIT (10/24/08)  AHI 36, min SPO2 87%   CPAP COMPLIANCE DATA:  Date Range: 03/25/21 - 03/24/22  Average Daily Use: 8 hours  30 minutes  Median Use: 8 hours 30 minutes  Compliance for > 4 Hours: 364 days  AHI: 5.8 respiratory events per hour  Days Used: 365/365  Mask Leak: 10.9  95th Percentile Pressure: 7 cmh20         LABS: No results found for this or any previous visit (from the past 2160 hour(s)).  Radiology: MR CERVICAL SPINE WO CONTRAST  Result Date: 06/13/2021 CLINICAL DATA:  Pain and numbness with tingling in all 4 extremities. EXAM: MRI CERVICAL SPINE WITHOUT CONTRAST TECHNIQUE: Multiplanar, multisequence MR imaging of the cervical spine was performed. No intravenous contrast was administered. COMPARISON:  11/26/2006 FINDINGS: Alignment: Reversal of lower cervical lordosis. Vertebrae: No fracture, evidence of discitis, or bone lesion. Cord: Normal signal and morphology. Posterior Fossa, vertebral arteries, paraspinal tissues: 2.3 cm right thyroid nodule. Hypertrophic appearance of the palatine tonsils. No perispinal mass or inflammation Disc levels: C2-3: Borderline left facet spurring C3-4: Mild disc bulging which contacts the ventral cord. Patent foramina C4-5: Disc narrowing and bulging. Negative facets. Disc material causes moderate bilateral foraminal encroachment. Partial effacement of ventral subarachnoid space C5-6: Disc narrowing and bulging with bilateral paracentral protrusion continuing into the bilateral foramina. Previously there was  asymmetric left paracentral herniation which is regressed. Spinal stenosis with partial effacement of CSF. Disc material causes biforaminal moderate impingement C6-7: Disc narrowing with endplate ridging and disc bulging eccentric to the right. Asymmetric right uncovertebral spurring with underlying buttressing osteophyte. Right foraminal impingement. Disc and osteophyte mildly flattens the right ventral cord C7-T1:Disc narrowing and foraminal predominant bulging. Moderate bilateral foraminal impingement and mild spinal stenosis. IMPRESSION: Generalized cervical spine degeneration since 2008. Moderate foraminal narrowing bilaterally at C4-5 to C7-T1 except at C6-7 where foraminal stenosis is only right-sided. Disc material indents the cord at C6-7 towards the right. 2.3 cm right thyroid nodule. Recommend thyroid US.(Ref: J Am Coll Radiol. 2015 Feb;12(2): 143-50). Electronically Signed   By: Jorje Guild M.D.   On: 06/13/2021 14:02    No results found.  No results found.    Assessment and Plan: Patient Active Problem List   Diagnosis Date Noted   S/P partial thyroidectomy 12/12/2021   Right thyroid nodule 11/30/2021   OSA on CPAP 03/26/2021   Morbid obesity ([place]) 03/26/2021   CPAP use counseling 03/27/2020   Allergic rhinitis 03/27/2020   Anxiety and depression 03/27/2020   Sleep apnea 03/27/2020   Migraine 04/09/2011   IBS (irritable bowel syndrome) 11/29/2010   1. OSA on CPAP The patient does tolerate PAP and reports  benefit from PAP use. Her apnea is not quite controlled, her AHI is now 5.8 (was 3.8 last year) so we will increase her pressure to 8 cm.  The patient was reminded how to clean equipment and advised to replace supplies routinely. The patient was also counselled on weight loss. The compliance is excellent. .  OSA on cpap- not optimally controlled. Increase to 8 cm, 2 week download. Continue with excellent compliance with pap. CPAP continues to be medically necessary to treat  this patient's OSA. F/u one year.    2. CPAP use counseling CPAP Counseling: had a lengthy discussion with the patient regarding the importance of PAP therapy in management of the sleep apnea. Patient appears to understand the risk factor reduction and also understands the risks associated with untreated sleep apnea. Patient will try to make a good faith effort to remain compliant with therapy. Also instructed the patient on proper cleaning of the device including the water must be  changed daily if possible and use of distilled water is preferred. Patient understands that the machine should be regularly cleaned with appropriate recommended cleaning solutions that do not damage the PAP machine for example given white vinegar and water rinses. Other methods such as ozone treatment may not be as good as these simple methods to achieve cleaning.   3. Morbid obesity ([place]) Obesity Counseling: Had a lengthy discussion regarding patients BMI and weight issues. Patient was instructed on portion control as well as increased activity. Also discussed caloric restrictions with trying to maintain intake less than 2000 Kcal. Discussions were made in accordance with the 5As of weight management. Simple actions such as not eating late and if able to, taking a walk is suggested.      General Counseling: I have discussed the findings of the evaluation and examination with [facility].  I have also discussed any further diagnostic evaluation thatmay be needed or ordered today. Khristine verbalizes understanding of the findings of todays visit. We also reviewed her medications today and discussed drug interactions and side effects including but not limited excessive drowsiness and altered mental states. We also discussed that there is always a risk not just to her but also people around her. she has been encouraged to call the office with any questions or concerns that should arise related to todays visit.  No orders of the defined  types were placed in this encounter.       I have personally obtained a history, examined the patient, evaluated laboratory and imaging results, formulated the assessment and plan and placed orders. This patient was seen today by Vickie Ellis, PA-C in collaboration with Dr. Devona Konig.   Allyne Gee, MD [facility] Diplomate ABMS Pulmonary Critical Care Medicine and Sleep Medicine

## 2022-03-25 NOTE — Patient Instructions (Signed)

## 2022-06-21 DIAGNOSIS — R7303 Prediabetes: Secondary | ICD-10-CM | POA: Insufficient documentation

## 2022-07-10 LAB — EXTERNAL GENERIC LAB PROCEDURE: COLOGUARD: NEGATIVE

## 2022-09-02 IMAGING — MR MR CERVICAL SPINE W/O CM
4 of 5 series · 29 of 48 positions shown · non-contrast
Comparison: 11/26/2006

CLINICAL DATA: Pain and numbness with tingling in all 4
extremities.

EXAM:
MRI CERVICAL SPINE WITHOUT CONTRAST
TECHNIQUE: Multiplanar, multisequence MR imaging of the cervical spine was
performed. No intravenous contrast was administered.

[Series 6: T1 · sagittal · 3.0mm · 0.66mm/px · 8 of 17 slices shown]
[im 1/17]
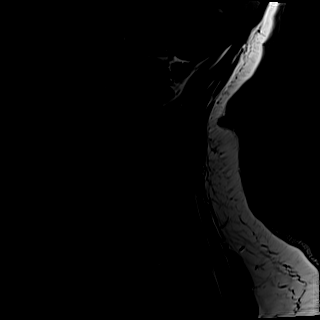
[im 3/17]
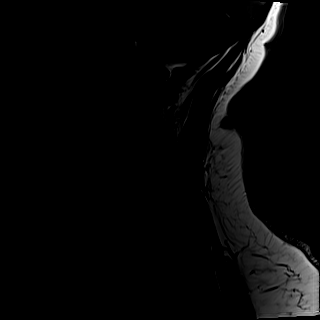
[im 5/17]
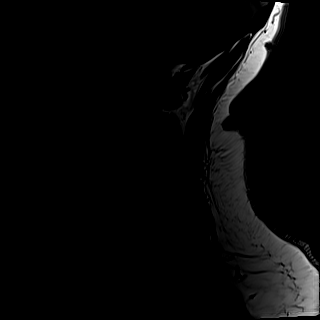
[im 7/17]
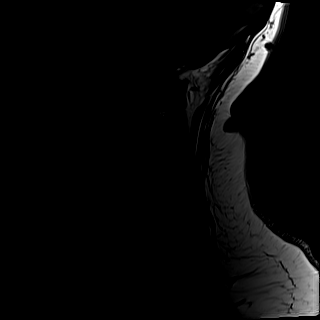
[im 10/17]
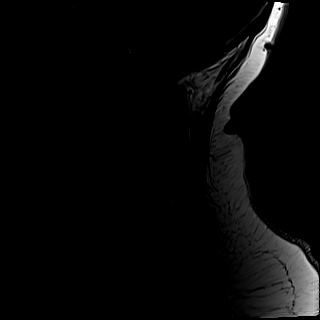
[im 12/17]
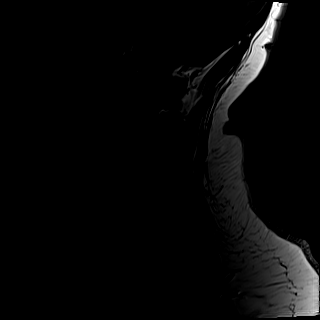
[im 14/17]
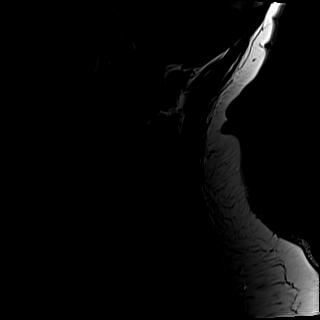
[im 17/17]
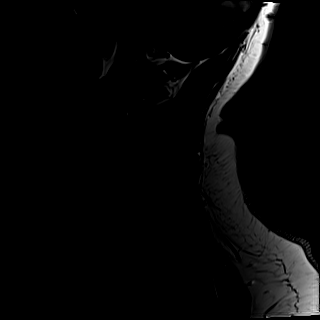

[Series 7: T2 · sagittal · 3.0mm · 0.55mm/px · 7 of 17 slices shown (1 of 2)]
[im 1/17]
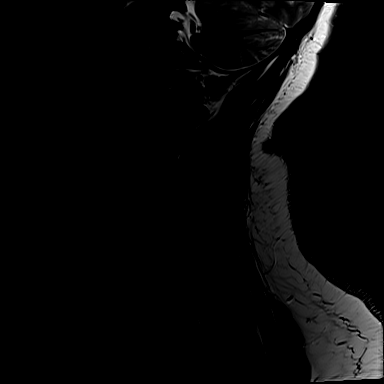
[im 3/17]
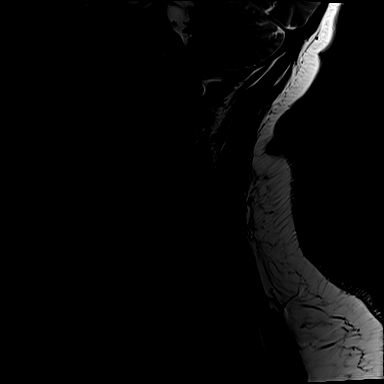
[im 6/17]
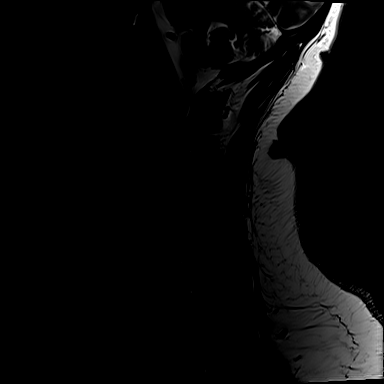
[im 9/17]
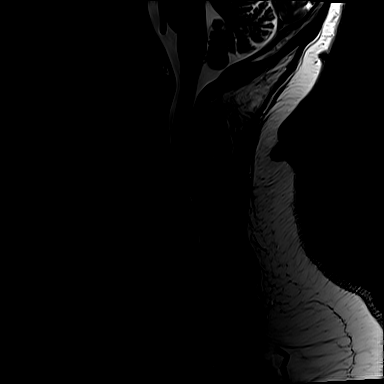
[im 11/17]
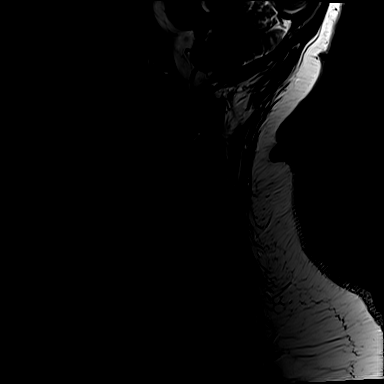
[im 14/17]
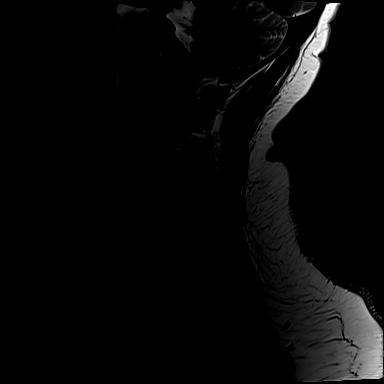
[im 17/17]
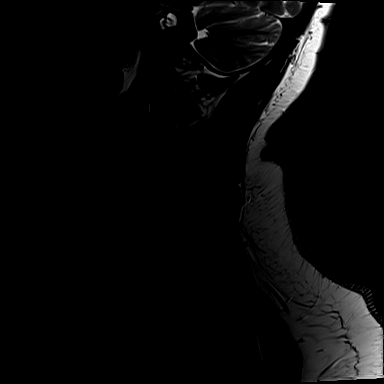

[Series 8: STIR · sagittal · 3.0mm · 0.33mm/px · 5 of 17 slices shown]
[im 1/17]
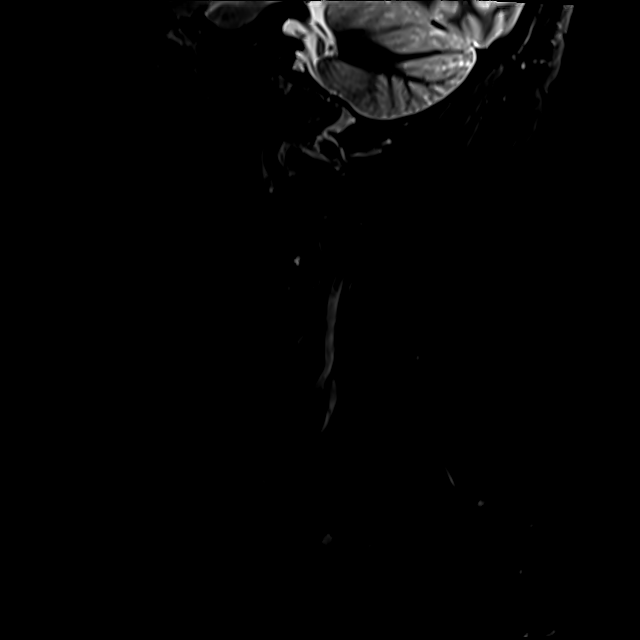
[im 3/17]
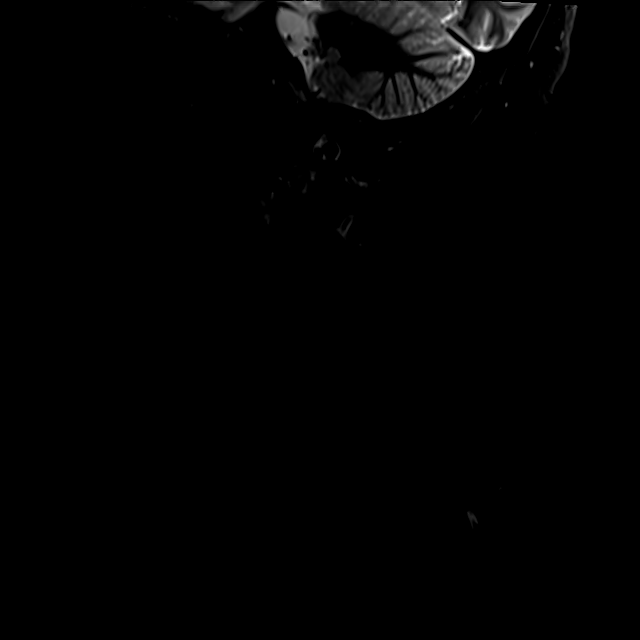
[im 6/17]
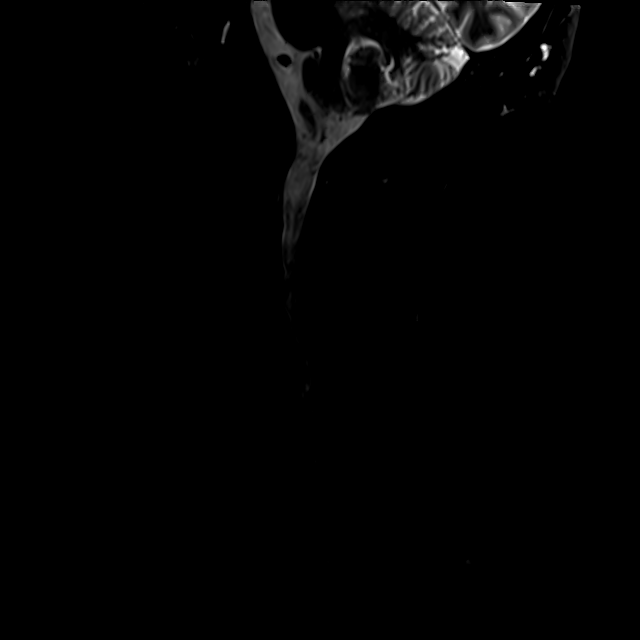
[im 9/17]
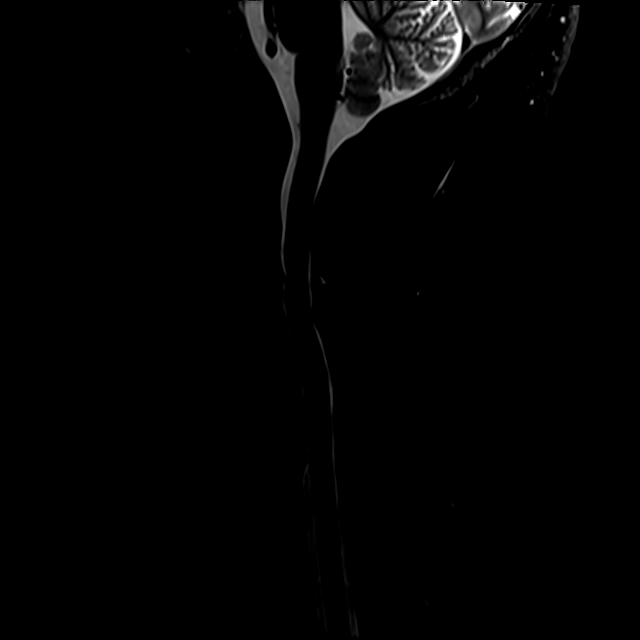
[im 14/17]
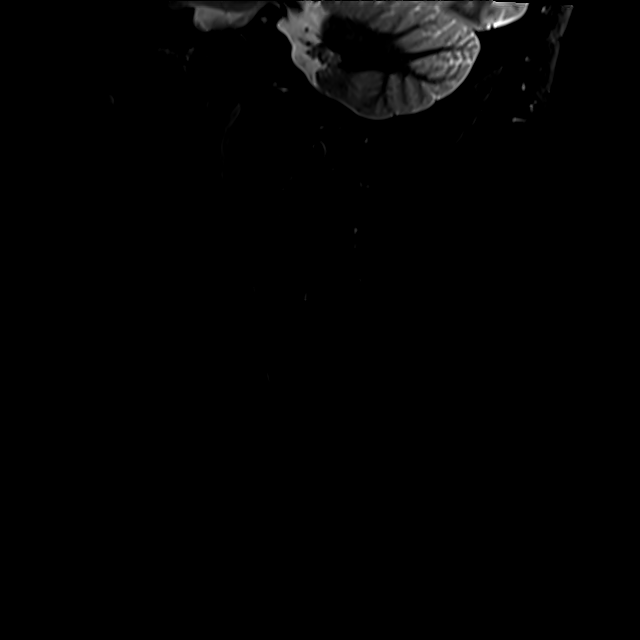

[Series 9: T2 · axial · 3.0mm · 0.50mm/px · z∈[-70,+31]mm · 9 of 32 slices shown (2 of 2)]
[im 1/32]
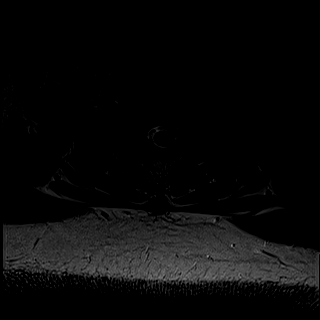
[im 6/32]
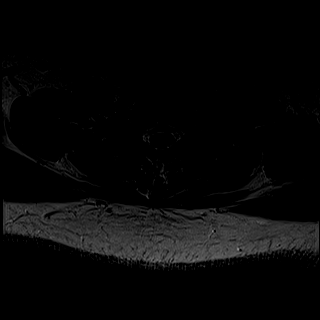
[im 11/32]
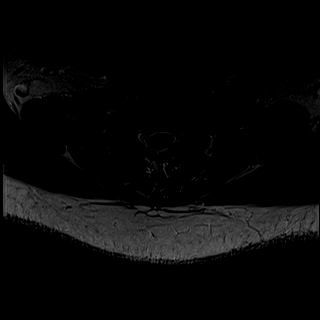
[im 13/32]
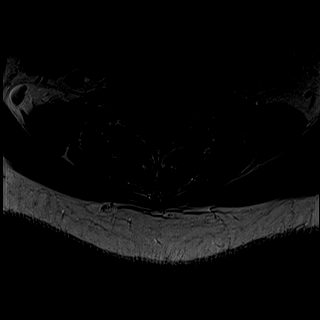
[im 16/32]
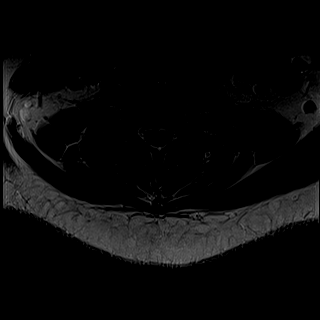
[im 19/32]
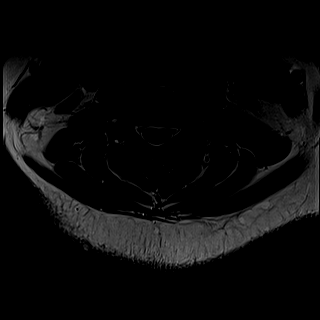
[im 21/32]
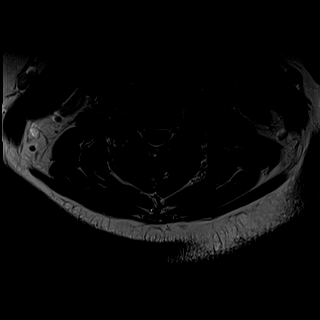
[im 26/32]
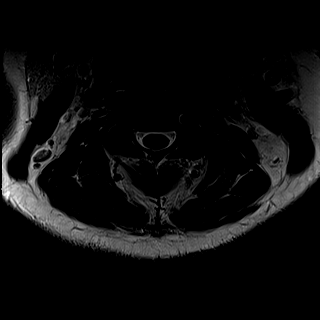
[im 32/32]
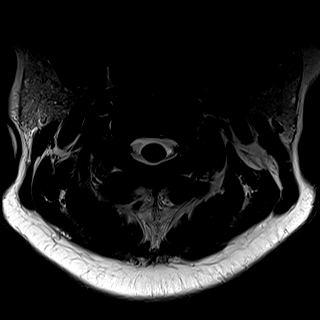

[29 of 48 positions shown; findings below may reference images not displayed]

FINDINGS: Alignment: Reversal of lower cervical lordosis.

Vertebrae: No fracture, evidence of discitis, or bone lesion.

Cord: Normal signal and morphology.

Posterior Fossa, vertebral arteries, paraspinal tissues: 2.3 cm
right thyroid nodule. Hypertrophic appearance of the palatine
tonsils. No perispinal mass or inflammation

Disc levels:

C2-3: Borderline left facet spurring

C3-4: Mild disc bulging which contacts the ventral cord. Patent
foramina

C4-5: Disc narrowing and bulging. Negative facets. Disc material
causes moderate bilateral foraminal encroachment. Partial effacement
of ventral subarachnoid space

C5-6: Disc narrowing and bulging with bilateral paracentral
protrusion continuing into the bilateral foramina. Previously there
was asymmetric left paracentral herniation which is regressed.
Spinal stenosis with partial effacement of CSF. Disc material causes
biforaminal moderate impingement

C6-7: Disc narrowing with endplate ridging and disc bulging
eccentric to the right. Asymmetric right uncovertebral spurring with
underlying buttressing osteophyte. Right foraminal impingement. Disc
and osteophyte mildly flattens the right ventral cord

C7-T1:Disc narrowing and foraminal predominant bulging. Moderate
bilateral foraminal impingement and mild spinal stenosis.
IMPRESSION: Generalized cervical spine degeneration since 1337.

Moderate foraminal narrowing bilaterally at C4-5 to C7-T1 except at
C6-7 where foraminal stenosis is only right-sided.

Disc material indents the cord at C6-7 towards the right.

2.3 cm right thyroid nodule. Recommend thyroid US.(Ref: [HOSPITAL]. [DATE]): 143-50).

## 2023-03-30 NOTE — Progress Notes (Signed)
 [facility] [address] [place], [place] [address]  Pulmonary Sleep Medicine   Office Visit Note  Patient Name: Vickie Reed DOB: 1975/07/02 MRN [id number]    Chief Complaint: Obstructive Sleep Apnea visit  Brief History:  Tykeshia is seen today for an annual follow up on APAP 5-12cmh20. The patient has a 15 year  history of sleep apnea. Patient is using PAP nightly.  The patient feels rested after sleeping with PAP.  The patient reports benefit from PAP use. Reported sleepiness is  improved and the Epworth Sleepiness Score is 11 out of 24. The patient will take naps on the weekend from 30 min-2hr. Encouraged to use CPAP when napping. The patient complains of the following: no major complaints.  The compliance download shows 97% compliance with an average use time of 8 hours 23  min.  The AHI is 5.8  The patient does not complain of limb movements disrupting sleep.  ROS  General: (-) fever, (-) chills, (-) night sweat Nose and Sinuses: (-) nasal stuffiness or itchiness, (-) postnasal drip, (-) nosebleeds, (-) sinus trouble. Mouth and Throat: (-) sore throat, (-) hoarseness. Neck: (-) swollen glands, (-) enlarged thyroid, (-) neck pain. Respiratory: - cough, - shortness of breath, - wheezing. Neurologic: - numbness, - tingling. Psychiatric: + anxiety, + depression   Current Medication: Outpatient Encounter Medications as of 03/31/2023  Medication Sig   metFORMIN (GLUCOPHAGE-XR) 500 MG 24 hr tablet TAKE 1 TABLET BY MOUTH EVERY DAY WITH DINNER   Vitamin D, Ergocalciferol, (DRISDOL) 1.25 MG (50000 UNIT) CAPS capsule Take 50,000 Units by mouth once a week.   [DISCONTINUED] doxycycline (VIBRA-TABS) 100 MG tablet TAKE 1 TABLET BY MOUTH 2 TIMES DAILY FOR 150 DAYS   buPROPion (WELLBUTRIN XL) 300 MG 24 hr tablet Take by mouth.   Cranberry POWD Take 2 tablets by mouth daily.   DULoxetine (CYMBALTA) 20 MG capsule Take 2 capsules by mouth daily.   Lactobacillus  Rhamnosus, GG, (CULTURELLE IMMUNITY SUPPORT PO) Take 1 capsule by mouth every evening.   loratadine (CLARITIN) 10 MG tablet Take 10 mg by mouth daily.   Multiple Vitamin (MULTIVITAMIN WITH MINERALS) TABS tablet Take 1 tablet by mouth every evening. Women's One-A-Day   Soft Lens Products (REWETTING DROPS) SOLN Place 1 drop into both eyes daily as needed (for irritation/dry eyes.).   SYNTHROID 75 MCG tablet Take 75 mcg by mouth daily.   [DISCONTINUED] levothyroxine (SYNTHROID) 50 MCG tablet Take 50 mcg by mouth daily.   [DISCONTINUED] meloxicam (MOBIC) 15 MG tablet Take by mouth.   No facility-administered encounter medications on file as of 03/31/2023.    Surgical History: Past Surgical History:  Procedure Laterality Date   DILITATION & CURRETTAGE/HYSTROSCOPY WITH NOVASURE ABLATION N/A 06/09/2017   Procedure: DILATATION & CURETTAGE/HYSTEROSCOPY WITH NOVASURE ABLATION;  Surgeon: Maxie Better, MD;  Location: [facility];  Service: Gynecology;  Laterality: N/A;   LAPAROSCOPIC TUBAL LIGATION Bilateral 06/09/2017   Procedure: LAPAROSCOPIC TUBAL LIGATION;  Surgeon: Maxie Better, MD;  Location: [facility];  Service: Gynecology;  Laterality: Bilateral;   NASAL SINUS SURGERY     THYROID LOBECTOMY     WISDOM TOOTH EXTRACTION  2011    Medical History: Past Medical History:  Diagnosis Date   Anxiety    Bulging lumbar disc    cervica spine   Cancer (HCC) 12/2016   SKIN UPPER CHEST NON SMALL CELL SQUAMOUS    Depression    IBS (irritable bowel syndrome)    Migraine    Neuromuscular  disorder (HCC)    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   Sleep apnea     Family History: Non contributory to the present illness  Social History: Social History   Socioeconomic History   Marital status: Married    Spouse name: Not on file   Number of children: Not on file   Years of education: Not on file   Highest education level: Not on file  Occupational History   Not on file  Tobacco Use   Smoking  status: Never   Smokeless tobacco: Never  Vaping Use   Vaping status: Never Used  Substance and Sexual Activity   Alcohol use: No   Drug use: No   Sexual activity: Not on file  Other Topics Concern   Not on file  Social History Narrative   Not on file   Social Drivers of Health   Financial Resource Strain: Low Risk  (11/06/2022)   Received from [facility]   Overall Financial Resource Strain (CARDIA)    Difficulty of Paying Living Expenses: Not very hard  Food Insecurity: No Food Insecurity (11/06/2022)   Received from [facility]   Hunger Vital Sign    Worried About Running Out of Food in the Last Year: Never true    Ran Out of Food in the Last Year: Never true  Transportation Needs: No Transportation Needs (11/06/2022)   Received from [facility] - Transportation    In the past 12 months, has lack of transportation kept you from medical appointments or from getting medications?: No    Lack of Transportation (Non-Medical): No  Physical Activity: Not on file  Stress: Not on file  Social Connections: Not on file  Intimate Partner Violence: Not on file    Vital Signs: Blood pressure 122/78, pulse 70, resp. rate 12, height 5\' 4"  (1.626 m), weight 282 lb (127.9 kg), SpO2 97%. Body mass index is 48.41 kg/m.    Examination: General Appearance: The patient is well-developed, well-nourished, and in no distress. Neck Circumference: 44cm Skin: Gross inspection of skin unremarkable. Head: normocephalic, no gross deformities. Eyes: no gross deformities noted. ENT: ears appear grossly normal Neurologic: Alert and oriented. No involuntary movements.  STOP BANG RISK ASSESSMENT S (snore) Have you been told that you snore?     No   T (tired) Are you often tired, fatigued, or sleepy during the day?   NO  O (obstruction) Do you stop breathing, choke, or gasp during sleep? NO   P (pressure) Do you have or are you being  treated for high blood pressure? NO   B (BMI) Is your body index greater than 35 kg/m? YES   A (age) Are you 29 years old or older? NO   N (neck) Do you have a neck circumference greater than 16 inches?   YES   G (gender) Are you a female? NO   TOTAL STOP/BANG "YES" ANSWERS 2       A STOP-Bang score of 2 or less is considered low risk, and a score of 5 or more is high risk for having either moderate or severe OSA. For people who score 3 or 4, doctors may need to perform further assessment to determine how likely they are to have OSA.         EPWORTH SLEEPINESS SCALE:  Scale:  (0)= no chance of dozing; (1)= slight chance of dozing; (2)= moderate chance of dozing; (3)= high chance of dozing  Chance  Situtation    Sitting and reading: 2    Watching TV: 2    Sitting Inactive in public: 1    As a passenger in car: 3      Lying down to rest: 3    Sitting and talking: 0    Sitting quielty after lunch: 1    In a car, stopped in traffic: 0   TOTAL SCORE:   11 out of 24    SLEEP STUDIES:  Split - 10/24/08 - AHI 36/hr,  min Sp02 87%   CPAP COMPLIANCE DATA:  Date Range: 05/29/2022- 03/26/2023  Average Daily Use: 8 hours 23 min  Median Use: 8 hrs 25 min  Compliance for > 4 Hours: 97% days  AHI: 5.8 respiratory events per hour  Days Used: 299/302  Mask Leak: 19.1  95th Percentile Pressure: 11.6 cmh20         LABS: No results found for this or any previous visit (from the past 2160 hours).  Radiology: MR CERVICAL SPINE WO CONTRAST Result Date: 06/13/2021 CLINICAL DATA:  Pain and numbness with tingling in all 4 extremities. EXAM: MRI CERVICAL SPINE WITHOUT CONTRAST TECHNIQUE: Multiplanar, multisequence MR imaging of the cervical spine was performed. No intravenous contrast was administered. COMPARISON:  11/26/2006 FINDINGS: Alignment: Reversal of lower cervical lordosis. Vertebrae: No fracture, evidence of discitis, or bone lesion. Cord: Normal signal and  morphology. Posterior Fossa, vertebral arteries, paraspinal tissues: 2.3 cm right thyroid nodule. Hypertrophic appearance of the palatine tonsils. No perispinal mass or inflammation Disc levels: C2-3: Borderline left facet spurring C3-4: Mild disc bulging which contacts the ventral cord. Patent foramina C4-5: Disc narrowing and bulging. Negative facets. Disc material causes moderate bilateral foraminal encroachment. Partial effacement of ventral subarachnoid space C5-6: Disc narrowing and bulging with bilateral paracentral protrusion continuing into the bilateral foramina. Previously there was asymmetric left paracentral herniation which is regressed. Spinal stenosis with partial effacement of CSF. Disc material causes biforaminal moderate impingement C6-7: Disc narrowing with endplate ridging and disc bulging eccentric to the right. Asymmetric right uncovertebral spurring with underlying buttressing osteophyte. Right foraminal impingement. Disc and osteophyte mildly flattens the right ventral cord C7-T1:Disc narrowing and foraminal predominant bulging. Moderate bilateral foraminal impingement and mild spinal stenosis. IMPRESSION: Generalized cervical spine degeneration since 2008. Moderate foraminal narrowing bilaterally at C4-5 to C7-T1 except at C6-7 where foraminal stenosis is only right-sided. Disc material indents the cord at C6-7 towards the right. 2.3 cm right thyroid nodule. Recommend thyroid US.(Ref: J Am Coll Radiol. 2015 Feb;12(2): 143-50). Electronically Signed   By: Tiburcio Pea M.D.   On: 06/13/2021 14:02    No results found.  No results found.    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 OSA on CPAP (Primary) The patient does tolerate PAP and reports  benefit from PAP use. Her apneas are not optimally controlled and she is using the upper range of her pressure setting. We will increase her to 8-15 apap.  The patient was reminded how to clean equipment and advised to replace supplies routinely. The patient was also counselled on weight loss. The  compliance is excellent . The AHI is 5.8.   OSA on cpap- not optimally controlled. Change pressure to APAP 8-15 cm. 2 week download. F/up one year. CPAP continues to be medically necessary to treat this patient's OSA.    2. CPAP use counseling CPAP Counseling: had a lengthy discussion with the patient regarding the importance of PAP therapy in management of the sleep apnea. Patient appears to understand the risk factor reduction and also understands the risks associated with untreated sleep apnea. Patient will try to make a good faith effort to remain compliant with therapy. Also instructed the patient on proper cleaning of the device including the water must be changed daily if possible and use of distilled water is preferred. Patient understands that the machine should be regularly cleaned with appropriate recommended cleaning solutions that do not damage the PAP machine for example given white vinegar and water rinses. Other methods such as ozone treatment may not be as good as these simple methods to achieve cleaning.   3. Morbid obesity (HCC) Obesity Counseling: Had a lengthy discussion regarding patients BMI and weight issues. Patient was instructed on portion control as well as increased activity. Also discussed caloric restrictions with trying to maintain intake less than 2000 Kcal. Discussions were made in accordance with the 5As of weight management. Simple actions such as not eating late and if able to, taking a walk is suggested.       General  Counseling: I have discussed the findings of the evaluation and examination with [facility].  I have also discussed any further diagnostic evaluation thatmay be needed or ordered today. Kelsha verbalizes understanding of the findings of todays visit. We also reviewed her medications today and discussed drug interactions and side effects including but not limited excessive drowsiness and altered mental states. We also discussed that there is always a risk not just to her but also people around her. she has been encouraged to call the office with any questions or concerns that should arise related to todays visit.  No orders of the defined types were placed in this encounter.       I have personally obtained a history, examined the patient, evaluated laboratory and imaging results, formulated the assessment and plan and placed orders. This patient was seen today by Emmaline Kluver, PA-C in collaboration with Dr. Freda Munro.   Yevonne Pax, MD [facility] Diplomate ABMS Pulmonary Critical Care Medicine and Sleep Medicine

## 2023-03-31 ENCOUNTER — Ambulatory Visit (INDEPENDENT_AMBULATORY_CARE_PROVIDER_SITE_OTHER): Payer: No Typology Code available for payment source | Admitting: Internal Medicine

## 2023-03-31 VITALS — BP 122/78 | HR 70 | Resp 12 | Ht 64.0 in | Wt 282.0 lb

## 2023-03-31 DIAGNOSIS — Z7189 Other specified counseling: Secondary | ICD-10-CM | POA: Diagnosis not present

## 2023-03-31 DIAGNOSIS — G4733 Obstructive sleep apnea (adult) (pediatric): Secondary | ICD-10-CM

## 2023-03-31 NOTE — Patient Instructions (Signed)

## 2024-03-22 ENCOUNTER — Ambulatory Visit
